# Patient Record
Sex: Female | Born: 1940 | Race: White | Hispanic: No | Smoking: Never smoker
Health system: Southern US, Community
[De-identification: ages and names within clinical notes are randomized; demographics above are authoritative.]

## PROBLEM LIST (undated history)

## (undated) DIAGNOSIS — R011 Cardiac murmur, unspecified: Secondary | ICD-10-CM

## (undated) DIAGNOSIS — T4145XA Adverse effect of unspecified anesthetic, initial encounter: Secondary | ICD-10-CM

## (undated) DIAGNOSIS — E039 Hypothyroidism, unspecified: Secondary | ICD-10-CM

## (undated) DIAGNOSIS — N393 Stress incontinence (female) (male): Secondary | ICD-10-CM

## (undated) DIAGNOSIS — K859 Acute pancreatitis without necrosis or infection, unspecified: Secondary | ICD-10-CM

## (undated) DIAGNOSIS — Z9889 Other specified postprocedural states: Secondary | ICD-10-CM

## (undated) DIAGNOSIS — K219 Gastro-esophageal reflux disease without esophagitis: Secondary | ICD-10-CM

## (undated) DIAGNOSIS — N6019 Diffuse cystic mastopathy of unspecified breast: Secondary | ICD-10-CM

## (undated) DIAGNOSIS — N3020 Other chronic cystitis without hematuria: Secondary | ICD-10-CM

## (undated) DIAGNOSIS — R112 Nausea with vomiting, unspecified: Secondary | ICD-10-CM

## (undated) DIAGNOSIS — I10 Essential (primary) hypertension: Secondary | ICD-10-CM

## (undated) DIAGNOSIS — K8020 Calculus of gallbladder without cholecystitis without obstruction: Secondary | ICD-10-CM

## (undated) DIAGNOSIS — T8859XA Other complications of anesthesia, initial encounter: Secondary | ICD-10-CM

## (undated) DIAGNOSIS — E669 Obesity, unspecified: Secondary | ICD-10-CM

## (undated) DIAGNOSIS — D649 Anemia, unspecified: Secondary | ICD-10-CM

## (undated) DIAGNOSIS — K5790 Diverticulosis of intestine, part unspecified, without perforation or abscess without bleeding: Secondary | ICD-10-CM

## (undated) DIAGNOSIS — R1310 Dysphagia, unspecified: Secondary | ICD-10-CM

## (undated) DIAGNOSIS — M1990 Unspecified osteoarthritis, unspecified site: Secondary | ICD-10-CM

## (undated) HISTORY — PX: TRIGGER FINGER RELEASE: SHX641

## (undated) HISTORY — PX: JOINT REPLACEMENT: SHX530

## (undated) HISTORY — PX: COLONOSCOPY: SHX174

## (undated) HISTORY — PX: CHOLECYSTECTOMY: SHX55

## (undated) HISTORY — PX: MENISCUS REPAIR: SHX5179

## (undated) HISTORY — PX: ABDOMINAL HYSTERECTOMY: SHX81

## (undated) HISTORY — PX: BREAST CYST ASPIRATION: SHX578

## (undated) HISTORY — PX: PANCREAS SURGERY: SHX731

## (undated) HISTORY — PX: TONSILLECTOMY: SUR1361

---

## 1999-06-02 HISTORY — PX: KNEE ARTHROSCOPY: SUR90

## 2004-08-26 ENCOUNTER — Ambulatory Visit: Payer: Self-pay

## 2005-09-08 ENCOUNTER — Ambulatory Visit: Payer: Self-pay | Admitting: Internal Medicine

## 2006-10-14 ENCOUNTER — Ambulatory Visit: Payer: Self-pay | Admitting: Internal Medicine

## 2007-11-02 ENCOUNTER — Ambulatory Visit: Payer: Self-pay | Admitting: Internal Medicine

## 2008-11-06 ENCOUNTER — Ambulatory Visit: Payer: Self-pay | Admitting: Internal Medicine

## 2008-12-07 ENCOUNTER — Ambulatory Visit: Payer: Self-pay | Admitting: Unknown Physician Specialty

## 2009-11-21 ENCOUNTER — Ambulatory Visit: Payer: Self-pay | Admitting: Internal Medicine

## 2010-12-12 ENCOUNTER — Ambulatory Visit: Payer: Self-pay | Admitting: Obstetrics and Gynecology

## 2010-12-25 ENCOUNTER — Ambulatory Visit: Payer: Self-pay | Admitting: Internal Medicine

## 2011-09-22 ENCOUNTER — Inpatient Hospital Stay: Payer: Self-pay | Admitting: Surgery

## 2011-09-22 LAB — CK TOTAL AND CKMB (NOT AT ARMC)
CK, Total: 59 U/L (ref 21–215)
CK-MB: 1.1 ng/mL (ref 0.5–3.6)

## 2011-09-22 LAB — COMPREHENSIVE METABOLIC PANEL
Albumin: 3.6 g/dL (ref 3.4–5.0)
Alkaline Phosphatase: 124 U/L (ref 50–136)
Anion Gap: 6 — ABNORMAL LOW (ref 7–16)
BUN: 17 mg/dL (ref 7–18)
Bilirubin,Total: 0.3 mg/dL (ref 0.2–1.0)
Calcium, Total: 9 mg/dL (ref 8.5–10.1)
Chloride: 106 mmol/L (ref 98–107)
Co2: 30 mmol/L (ref 21–32)
EGFR (Non-African Amer.): >60
Osmolality: 285 (ref 275–301)
SGPT (ALT): 37 U/L
Sodium: 142 mmol/L (ref 136–145)
Total Protein: 7.3 g/dL (ref 6.4–8.2)

## 2011-09-22 LAB — CBC
HCT: 40.8 % (ref 35.0–47.0)
HGB: 13 g/dL (ref 12.0–16.0)
MCH: 27.9 pg (ref 26.0–34.0)
MCV: 87 fL (ref 80–100)
Platelet: 247 10*3/uL (ref 150–440)
RBC: 4.67 10*6/uL (ref 3.80–5.20)
WBC: 7.4 10*3/uL (ref 3.6–11.0)

## 2011-09-22 LAB — LIPASE, BLOOD: Lipase: >3000 U/L (ref 73–393)

## 2011-09-23 LAB — CBC WITH DIFFERENTIAL/PLATELET
Basophil #: 0 10*3/uL (ref 0.0–0.1)
Basophil %: 0.1 %
HCT: 35.8 % (ref 35.0–47.0)
HGB: 11.8 g/dL — ABNORMAL LOW (ref 12.0–16.0)
Lymphocyte #: 1.7 10*3/uL (ref 1.0–3.6)
Lymphocyte %: 19.4 %
MCHC: 32.9 g/dL (ref 32.0–36.0)
MCV: 87 fL (ref 80–100)
Monocyte #: 0.8 x10 3/mm (ref 0.2–0.9)
Monocyte %: 9.2 %
Neutrophil #: 6.1 10*3/uL (ref 1.4–6.5)
Platelet: 189 10*3/uL (ref 150–440)
RBC: 4.12 10*6/uL (ref 3.80–5.20)
RDW: 16.2 % — ABNORMAL HIGH (ref 11.5–14.5)
WBC: 8.5 10*3/uL (ref 3.6–11.0)

## 2011-09-23 LAB — COMPREHENSIVE METABOLIC PANEL
Alkaline Phosphatase: 127 U/L (ref 50–136)
Anion Gap: 6 — ABNORMAL LOW (ref 7–16)
Calcium, Total: 8.4 mg/dL — ABNORMAL LOW (ref 8.5–10.1)
Chloride: 107 mmol/L (ref 98–107)
EGFR (African American): >60
EGFR (Non-African Amer.): >60
Glucose: 100 mg/dL — ABNORMAL HIGH (ref 65–99)
Osmolality: 281 (ref 275–301)
Potassium: 4.1 mmol/L (ref 3.5–5.1)
SGOT(AST): 92 U/L — ABNORMAL HIGH (ref 15–37)
Total Protein: 6.2 g/dL — ABNORMAL LOW (ref 6.4–8.2)

## 2011-09-23 LAB — URINALYSIS, COMPLETE
Bilirubin,UR: NEGATIVE
Blood: NEGATIVE
Ketone: NEGATIVE
Leukocyte Esterase: NEGATIVE
Protein: NEGATIVE
RBC,UR: 1 /HPF (ref 0–5)
Specific Gravity: 1.011 (ref 1.003–1.030)
Squamous Epithelial: 1

## 2011-09-24 LAB — LIPASE, BLOOD: Lipase: 912 U/L — ABNORMAL HIGH (ref 73–393)

## 2011-09-24 LAB — CEA: CEA: 1.9 ng/mL (ref 0.0–4.7)

## 2011-09-24 LAB — CANCER ANTIGEN 19-9: CA 19-9: 15 U/mL (ref 0–35)

## 2011-09-25 LAB — BASIC METABOLIC PANEL
BUN: 7 mg/dL (ref 7–18)
Calcium, Total: 8.3 mg/dL — ABNORMAL LOW (ref 8.5–10.1)
Co2: 25 mmol/L (ref 21–32)
Creatinine: 0.54 mg/dL — ABNORMAL LOW (ref 0.60–1.30)
EGFR (African American): >60
EGFR (Non-African Amer.): >60
Glucose: 53 mg/dL — ABNORMAL LOW (ref 65–99)
Sodium: 142 mmol/L (ref 136–145)

## 2011-09-25 LAB — LIPASE, BLOOD: Lipase: 196 U/L (ref 73–393)

## 2011-09-26 LAB — CBC WITH DIFFERENTIAL/PLATELET
Basophil #: 0 10*3/uL (ref 0.0–0.1)
Eosinophil #: 0.2 10*3/uL (ref 0.0–0.7)
HGB: 11.1 g/dL — ABNORMAL LOW (ref 12.0–16.0)
Lymphocyte #: 1.6 10*3/uL (ref 1.0–3.6)
MCH: 28.4 pg (ref 26.0–34.0)
MCV: 87 fL (ref 80–100)
Monocyte %: 12.3 %
Neutrophil #: 1.4 10*3/uL (ref 1.4–6.5)
Neutrophil %: 38.3 %
Platelet: 177 10*3/uL (ref 150–440)
RBC: 3.9 10*6/uL (ref 3.80–5.20)
RDW: 16 % — ABNORMAL HIGH (ref 11.5–14.5)
WBC: 3.7 10*3/uL (ref 3.6–11.0)

## 2011-09-26 LAB — COMPREHENSIVE METABOLIC PANEL
Albumin: 2.7 g/dL — ABNORMAL LOW (ref 3.4–5.0)
Alkaline Phosphatase: 140 U/L — ABNORMAL HIGH (ref 50–136)
Chloride: 108 mmol/L — ABNORMAL HIGH (ref 98–107)
Co2: 29 mmol/L (ref 21–32)
Creatinine: 0.54 mg/dL — ABNORMAL LOW (ref 0.60–1.30)
EGFR (Non-African Amer.): >60
Osmolality: 286 (ref 275–301)
Potassium: 3.4 mmol/L — ABNORMAL LOW (ref 3.5–5.1)
SGOT(AST): 34 U/L (ref 15–37)
SGPT (ALT): 44 U/L
Sodium: 146 mmol/L — ABNORMAL HIGH (ref 136–145)
Total Protein: 6.2 g/dL — ABNORMAL LOW (ref 6.4–8.2)

## 2011-10-23 ENCOUNTER — Ambulatory Visit: Payer: Self-pay | Admitting: Unknown Physician Specialty

## 2012-02-10 ENCOUNTER — Ambulatory Visit: Payer: Self-pay | Admitting: Internal Medicine

## 2012-09-16 ENCOUNTER — Ambulatory Visit: Payer: Self-pay | Admitting: Emergency Medicine

## 2012-11-15 ENCOUNTER — Emergency Department: Payer: Self-pay | Admitting: Emergency Medicine

## 2012-12-01 ENCOUNTER — Ambulatory Visit: Payer: Self-pay | Admitting: Unknown Physician Specialty

## 2013-02-15 ENCOUNTER — Ambulatory Visit: Payer: Self-pay | Admitting: Internal Medicine

## 2013-06-14 ENCOUNTER — Ambulatory Visit: Payer: Self-pay | Admitting: Unknown Physician Specialty

## 2013-07-09 ENCOUNTER — Ambulatory Visit: Payer: Self-pay | Admitting: Family Medicine

## 2013-10-05 ENCOUNTER — Ambulatory Visit: Payer: Self-pay | Admitting: Unknown Physician Specialty

## 2014-02-08 DIAGNOSIS — R1310 Dysphagia, unspecified: Secondary | ICD-10-CM

## 2014-02-08 HISTORY — DX: Dysphagia, unspecified: R13.10

## 2014-02-26 ENCOUNTER — Ambulatory Visit: Payer: Self-pay | Admitting: Unknown Physician Specialty

## 2014-02-26 HISTORY — PX: ESOPHAGOGASTRODUODENOSCOPY: SHX1529

## 2014-04-04 ENCOUNTER — Ambulatory Visit: Payer: Self-pay | Admitting: Internal Medicine

## 2014-09-23 NOTE — Consult Note (Signed)
Chief Complaint:   Subjective/Chief Complaint Looks lot better than yest. Feels better. Less abd pain. No nausea. Lipase improving. MRI done. Results pending.   VITAL SIGNS/ANCILLARY NOTES: **Vital Signs.:   25-Apr-13 13:27   Vital Signs Type Routine   Temperature Temperature (F) 97.6   Celsius 36.4   Temperature Source oral   Pulse Pulse 65   Pulse source per Dinamap   Respirations Respirations 18   Systolic BP Systolic BP 672   Diastolic BP (mmHg) Diastolic BP (mmHg) 70   Mean BP 87   BP Source Dinamap   Pulse Ox % Pulse Ox % 97   Pulse Ox Activity Level  At rest   Oxygen Delivery Room Air/ 21 %   Brief Assessment:   Cardiac Regular    Respiratory clear BS    Gastrointestinal mild abd tenderness. much improved from yest.   Routine Chem:  25-Apr-13 05:33    Lipase 912   Assessment/Plan:  Assessment/Plan:   Assessment Pancreatitis. Improving.    Plan Await MRI results. Await tumor markers.   Electronic Signatures: Verdie Shire (MD)  (Signed 25-Apr-13 13:31)  Authored: Chief Complaint, VITAL SIGNS/ANCILLARY NOTES, Brief Assessment, Lab Results, Assessment/Plan   Last Updated: 25-Apr-13 13:31 by Verdie Shire (MD)

## 2014-09-23 NOTE — Consult Note (Signed)
Chief Complaint:   Subjective/Chief Complaint Feels much better. No abd pain. Lipase back to normal. Started on clears.   VITAL SIGNS/ANCILLARY NOTES: **Vital Signs.:   26-Apr-13 08:59   Vital Signs Type Q 4hr   Temperature Temperature (F) 98.1   Celsius 36.7   Temperature Source oral   Pulse Pulse 70   Pulse source per Dinamap   Respirations Respirations 18   Systolic BP Systolic BP 199   Diastolic BP (mmHg) Diastolic BP (mmHg) 76   Mean BP 98   BP Source Dinamap   Pulse Ox % Pulse Ox % 97   Pulse Ox Activity Level  At rest   Oxygen Delivery Room Air/ 21 %   Brief Assessment:   Cardiac Regular    Respiratory clear BS    Gastrointestinal Normal   Routine Chem:  26-Apr-13 05:31    Glucose, Serum 53   BUN 7   Creatinine (comp) 0.54   Sodium, Serum 142   Potassium, Serum 3.5   Chloride, Serum 108   CO2, Serum 25   Calcium (Total), Serum 8.3   Osmolality (calc) 279   eGFR (African American) >60   eGFR (Non-African American) >60   Anion Gap 9   Lipase 196   Assessment/Plan:  Assessment/Plan:   Assessment Pancreatitis. CLinically resolved.    Plan Agree with dischage by tomorrow? if tolerates solids. Repeat CT in few wks. I will be out this weekend. If patient to be discharged, then patient can f/u in office later. Thanks.   Electronic Signatures: Verdie Shire (MD)  (Signed 26-Apr-13 13:11)  Authored: Chief Complaint, VITAL SIGNS/ANCILLARY NOTES, Brief Assessment, Lab Results, Assessment/Plan   Last Updated: 26-Apr-13 13:11 by Verdie Shire (MD)

## 2014-09-23 NOTE — Consult Note (Signed)
Pt seen and examined. 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 Previous attempt at ERCP failed due to periampullary tic. Pt developed post ERCP pancreatitis. EUS is an ideal procedure to evaluate this pancreatic tail mass. Unfortunately, at this time we don't know if and when Dr. Ardis Hughs will be here to do EUS. Check CA 19-9/CEA levels. CT guided bx is another option. Can repeat ERCP myself, but there is no guarantee that I will be able to access CBD. There is also increase risk of worsening pancreatitis with ERCP. Discussed options with family. Meanwhile, continue NPO and aggressive IV hydration. Will follow. Thanks.   Electronic Signatures: Verdie Shire (MD) (Signed on 24-Apr-13 15:18)  Authored   Last Updated: 24-Apr-13 15:19 by Verdie Shire (MD)

## 2014-09-23 NOTE — H&P (Signed)
PATIENT NAME:  JOHNISHA, LOUKS MR#:  [id number] DATE OF BIRTH:  [birth year]  DATE OF ADMISSION:  09/22/2011  ADMITTING DIAGNOSIS:  Acute pancreatitis.   HISTORY:  74 year old white female whose history dates back to 2004 at which point she was admitted to the hospital with what appeared to be gallstone pancreatitis. The patient had abdominal pain and nausea at that time and was found to have an elevated lipase. She had an ultrasound which demonstrated stones and sludge and was taken to the operating room at that time. Laparoscopic cholecystectomy and cholangiography were performed. Cholangiography was normal. Operative findings included retroperitoneal edema, stones, sludge, and normal cholangiogram, and nodular-appearing liver. A liver biopsy was performed, the results of which are not available to me at this time in the computer. After surgery, the patient did well. Eight months later the patient was readmitted with significant abdominal pain and recurrent pancreatitis of unclear etiology, thought to be either biliary, sphincter of Oddi dysfunction, or retained stone. An upper endoscopy and ERCP were attempted at that time by Dr. Sonny Masters in April 2005. A large, wide-mouth third portion of the duodenum diverticulum was encountered at that time. Pancreatogram was normal. Maximum diameter of the ducts was noted to be 4 mm. The bile duct could not be cannulated. There was bile drainage from the papilla at that time. No further attempts were made. Post ERCP the patient developed some pancreatitis which resolved after approximately one week's duration in the hospital. She then subsequently went home and did well. She has had no further abdominal pain. The patient does not drink, does not smoke, and is not on any current medications either prescription or over-the-counter. There is no family history of idiopathic pancreatitis. She has felt well. She had occasional belching. Today the patient started having significant  abdominal pain following lunch, followed by the development of abdominal pain and some mild nausea and worsening abdominal pain in her epigastrium. She brought herself to the Emergency Room and was found to have an elevated lipase. A CT scan was performed which I have reviewed with oral and intravenous contrast. 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 There is peripancreatic edema. There is no pancreatic pseudocyst. The pancreas appears edematous. In the tail of the pancreas there is a 4.8 x 3.13 cm homogeneously enhancing mass-like area extending into the spleen. The liver appears normal. The stomach and kidneys and adrenal glands appear normal. Within the left adnexa is an indeterminate-appearing, rounded mass measuring 2.6 x 2.4 cm. There are no previous CT scans at this institution for my review. Surgery was asked to evaluate the patient for admission.   ALLERGIES: Ampicillin and Keflex.   MEDICATIONS:   1. Fish oil.  2. Calcium with vitamin D.  3. Iron tablets. 4. Multivitamins.   PAST MEDICAL HISTORY:  1. Fibrocystic disease of the breast.  2. Diverticulosis of the colon.  3. Cholelithiasis.  4. Pancreatitis of unclear etiology.   PAST SURGICAL HISTORY:  1. Total hysterectomy.  2. Laparoscopic cholecystectomy as described above with cholangiography 2004.   REVIEW OF SYSTEMS: The patient denies any fever, jaundice, chills, or weight loss. She has had some minor belching. No dysphagia. No itching. No rash. No jaundice. Remaining ten-point review unremarkable.   PHYSICAL EXAMINATION:  GENERAL: The patient is alert and oriented with her son at the bedside. She is clearly anicteric. Facies are symmetrical, alert and oriented.   VITAL SIGNS: Temperature 97.2,  pulse 66, respiratory rate 18, blood pressure 174/76, room air saturation 100%. Weight 150 pounds. BMI 26.6.   LUNGS: Clear bilaterally.   HEART:  Regular rate and rhythm.   ABDOMEN: Abdomen demonstrates a lower midline scar. No obvious hernias. Mildly distended, tender throughout with significant tenderness in the epigastrium.   EXTREMITIES: Warm and well perfused.   NEUROLOGIC/PSYCHIATRIC: Normal.   LABORATORY, DIAGNOSTIC, AND RADIOLOGICAL DATA: Lipase greater than 3000, troponin less than 0.02. White count 7.3, hemoglobin 13, hematocrit 40.8, platelet count 247,000. Total bilirubin 0.3. Alkaline phosphatase 124, ALT 37, AST 45, albumin 3.6. Glucose 101, creatinine 0.64, sodium 142, potassium 4.2.   IMPRESSION:  74 year old white female with recurrent pancreatitis and suspicious areas seen within the tail of the pancreas concerning for pancreatic mass although I clearly see a vessel traversing the mass in its entirety which it typically not seen in a cancer. No previous CT scans are available to me at this time.   PLAN: The patient will be admitted, hydrated. Antiemetics and narcotics.  I will review the x-rays personally with the radiologist when he is available. I will ask GI for their evaluation. I suspect that she will require either further attempt at ERCP in the future or MRCP to further evaluate biliary system. Once the pancreatitis has resolved, we will consider re-imaging the pancreas at that time to determine whether the mass is still present. It is rather ill defined.   TOTAL TIME SPENT WITH THE PATIENT, CHART, X-RAYS: 60 minutes.   ____________________________ Jeannette How Marina Gravel, MD mab:bjt D: 09/22/2011 21:33:55 ET T: 09/23/2011 07:08:16 ET JOB#: [id number]  cc: Elta Guadeloupe A. Marina Gravel, MD, <Dictator> Ocie Cornfield. Ouida Sills, MD Manya Silvas, MD Wanda Rideout Bettina Gavia MD ELECTRONICALLY SIGNED 09/23/2011 8:57

## 2014-09-23 NOTE — Consult Note (Signed)
PATIENT NAME:  Felicia Daniels, Felicia Daniels MR#:  [id number] DATE OF BIRTH:  1941-05-06  DATE OF CONSULTATION:  09/23/2011  REFERRING PHYSICIAN:  Sherri Rad, MD  CONSULTING PHYSICIAN:  Verdie Shire, MD/Margean Korell Ruthe Mannan, NP  REASON FOR CONSULTATION: Recurrent pancreatitis.   HISTORY OF PRESENT ILLNESS: Felicia Daniels is a 74 year old Caucasian female who had presented to [facility] yesterday afternoon. Acute onset of abdominal pain after eating lunch occurred approximately half an hour after eating in the epigastrium. She became nauseated, diaphoretic, vomited once. Pain did refer through to her back. Currently she is experiencing dry heaves. The patient is on a PCA pump which is helping to control her pain better. Pain prior to presenting to the ER was causing her to be doubled over, severe, rated as a 10 on a scale of 1 to 10. Normal bowel pattern is every other day. Has not had a bowel movement for two days. Does experience mild constipation at times. No rectal bleeding. No melena. Intentional 15 pound weight loss over the past 1-1/2 months. The patient has been doing [employer].   The patient states this is her fourth episode of pancreatitis. First episode dates back to 2004 when she was admitted for gallstone pancreatitis. She is status post cholecystectomy at that time. The patient was found to have abdominal pain, nausea, as well as elevated lipase at that time. Abdominal ultrasound demonstrated stones and sludge. Laparoscopic cholecystectomy and cholangiogram was performed. Cholangiogram was normal. Operative findings were retroperitoneal edema, stones, sludge, normal cholangiogram, and nodular-appearing liver. A liver biopsy was performed at that time which was unremarkable according to the patient. Eight months later she was readmitted for abdominal pain and recurrent pancreatitis. It was thought to be biliary, possible sphincter of Oddi dysfunction, or retained stone. An ERCP was attempted by Dr. Rhona Leavens but  cholangiogram failed. A large wide mouth third portion of the duodenum diverticulum was encountered at that time. Pancreatogram was normal. Maximum diameter of ducts was noted to be 4 mm. Common bile duct could not be cannulated. The patient states that she has had three episodes of pancreatitis during her lifetime with unknown etiological cause for all of these episodes.   CURRENT MEDICATIONS:  1. Fish Oil. 2. Calcium with Vitamin D. 3. Iron 1 tablet a day.  4. Multivitamin.   PAST MEDICAL HISTORY:  1. Fibrocystic breast disease. 2. Diverticulosis. 3. Cholelithiasis with gallstone-induced pancreatitis at that time. 4. Pancreatitis, four occurrences of unknown etiological cause.   PAST SURGICAL HISTORY:  1. Total hysterectomy.  2. Laparoscopic cholecystectomy. 3. Right torn meniscus repair. 4. Two trigger finger releases.   FAMILY HISTORY: No family history of pancreatic or liver concerns. Daughter has history of breast cancer. Daughter has history of melanoma. Mother has history of skin cancer.   SOCIAL HISTORY: No tobacco. No alcohol use.   REVIEW OF SYSTEMS: All 10 systems reviewed and checked and otherwise unremarkable other than what is stated above.   PHYSICAL EXAMINATION:   VITAL SIGNS: Temperature 98.1, pulse 57, respirations 18, blood pressure 100/66 with pulse oximetry 95%.   GENERAL: Well developed, well nourished 74 year old Caucasian female who appears in moderate distress. Daughter present at bedside.   HEENT: Normocephalic, atraumatic. Pupils equal, reactive to light. Conjunctivae clear. Sclerae anicteric.   NECK: Supple. Trachea midline. No lymphadenopathy or thyromegaly.   PULMONARY: Symmetric rise and fall of chest. Clear to auscultation throughout noted. The patient does express pain with deep breath.   CARDIOVASCULAR: Regular rate and rhythm, S1, S2.  No murmurs. No gallops.   ABDOMEN: Slightly distended. Hypoactive bowel sounds. Marked discomfort throughout  entire abdomen, more localized epigastric. Due to amount of discomfort, unable to totally accurately assess for hepatosplenomegaly but no masses noted superficially. She is not able to tolerate deep palpation.   RECTAL: Deferred.   MUSCULOSKELETAL: Moving all four extremities. No contractures. No clubbing.   EXTREMITIES: No edema.   PSYCH: Alert and oriented x4. Appropriate affect and mood given ill state.   NEUROLOGICAL: No gross neurological deficits.   SKIN: Warm, dry. No lesions. No rashes. No evidence of jaundice or cyanosis.   LABORATORY, DIAGNOSTIC, AND RADIOLOGICAL DATA: Chemistry panel glucose 101, anion gap low at 6, otherwise within normal limits. Calcium was 9.1. Comparison today's date calcium declined to 8.4, glucose 100 today, lipase was greater than 3000. Hepatic panel AST 45 on admission; today's date, April 24th, total protein 6.2, albumin 3.0, and AST increased to 92 from 45.   Cardiac enzymes CK total 59, CK-MB 1.5, troponin less than 0.02. CBC on admission within normal limits except RDW elevated at 16.3, hemoglobin 13.0, hematocrit 40.8. Today hemoglobin declined to 11.8 and hematocrit 35.8. Urinalysis within normal limits.   Chest PA view revealed no acute cardiopulmonary disease.  Three-way of abdomen revealed nonspecific bowel gas pattern, possible air fluid level proximal to the mid small bowel, possible correlation with an ileus.   CT of abdomen and pelvis with contrast revealed prominence of interstitial markings in the lung bases as well as mild hypoventilation, mild intrahepatic biliary ductal dilatation as well as prominence of extrahepatic biliary duct. No evidence of liver masses or regions of abnormal parenchymal small enhancement. Spleen, adrenals, and kidneys are normal. Pancreas evaluation demonstrated peripancreatic fluid without evidence of a pseudocyst. Within the region of the tail a homogeneously enhancing mass-like area is appreciated measuring 4.8 x  3.13 cm maximal orthogonal dimensions extending to the splenic hilar region concerning for a mass in the tail of the pancreas, otherwise, pancreas appeared slightly edematous and there is loss of normal undulating border. Second portion of the duodenum contained a focal area of 4.73 x 2.81 cm containing air-fluid level primarily containing contrast. Correlates with a large duodenal diverticulum versus a duodenal ulcer.   IMPRESSION: This is a 74 year old Caucasian female who presented to [facility] Room for the concern of acute onset of abdominal pain, epigastric, which radiated throughout entire abdomen with associated nausea and vomiting. Known history of recurrent pancreatitis in the past, most recent episode prior to being diagnosed with acute pancreatitis on admission was nine years ago. CT scan of abdomen and pelvis revealing concern of a pancreatic mass in the tail of the pancreas.   PLAN:  1. Will await Dr. Delice Lesch recommendations. The patient is to be seen by him today.  2. 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  3. Will increase the patient's IV fluids to allow increased hydration status given acute pancreatic state at this time. Will increase to 200 mL an hour.  4. Will continue to monitor her pain as well as laboratory evaluation. At some point the patient may benefit from having an EUS performed.  These services provided by Payton Emerald, NP under collaborative agreement with Verdie Shire, MD.  ____________________________ Payton Emerald, NP dsh:drc D: 09/23/2011 13:15:00 ET T: 09/23/2011 13:34:07 ET JOB#: [id number]  cc: Payton Emerald, NP, <Dictator> Payton Emerald MD ELECTRONICALLY SIGNED 09/23/2011 16:29

## 2014-09-23 NOTE — Consult Note (Signed)
Brief Consult Note: Diagnosis: Acute pancreatitis in the setting of known history of three prior episodes of pancreatitis with unknown etiological cause.  Abnormal CT scan of abdomen and pelvis with finding of pancreatic mass.   Consult note dictated.   Comments: Patient's presentation was discussed with Dr. Verdie Shire. Will increase IV fluids of LR to 200 ml/hr. Monitor pain and laboratory results.  Pending surgical evalution by Dr. Eula Listen based on finding of CT scan of abdomen and pelvis.  NPO except for ice chips at this time.  Patient may benefit with EUS in the future.  Electronic Signatures: Payton Emerald (NP)  (Signed 24-Apr-13 13:19)  Authored: Brief Consult Note   Last Updated: 24-Apr-13 13:19 by Payton Emerald (NP)

## 2014-09-23 NOTE — Discharge Summary (Signed)
PATIENT NAME:  Felicia Daniels, Felicia Daniels MR#:  [id number] DATE OF BIRTH:  August 06, 1940  DATE OF ADMISSION:  09/22/2011 DATE OF DISCHARGE:  09/26/2011  DISCHARGE DIAGNOSES:  1. Idiopathic pancreatitis. 2. Diverticulosis.   PROCEDURES: None.   CONSULTANT: Gastroenterology service.   HISTORY OF PRESENT ILLNESS/HOSPITAL COURSE: This is a patient with a long history of acute appendicitis which has been recurrent. This has happened several times. She has had a work-up in the past. She has had two ERCPs in the past. She has had her gallbladder removed nine years ago and has never shown any signs of retained stone. She was admitted to the hospital with the diagnosis of acute pancreatitis. Her lipase promptly returned to normal. Her pain is resolved at this point and she is tolerating a clear liquid diet.   I discussed with her the fact that we do not know the cause of this and this has been worked up by her gastroenterology service. Both Dr. Candace Cruise and Dr. Vira Agar have seen the patient. We are not sure of the actual cause of this, but it would be prudent to limit the fat intake in this patient. She is currently on a clear liquid diet and tolerating it without problems and not having any signs of dehydration. I suggested discharge today on clear liquids with the instructions to slowly advance, avoiding high fat foods and staying away from spicy foods as well. This was all reviewed for her and she was in agreement with plans for discharge. She will follow up with Dr. Ouida Sills, her primary care physician, as well as the gastroenterology service, either Dr. Candace Cruise or Dr. Vira Agar the next week.  ____________________________ Jerrol Banana. Burt Knack, MD rec:ap D: 09/26/2011 09:35:19 ET T: 09/26/2011 14:43:10 ET JOB#: [id number] cc: Jerrol Banana. Burt Knack, MD, <Dictator> Florene Glen MD ELECTRONICALLY SIGNED 09/26/2011 15:57

## 2015-01-28 ENCOUNTER — Encounter: Payer: Self-pay | Admitting: Emergency Medicine

## 2015-01-28 ENCOUNTER — Observation Stay
Admission: EM | Admit: 2015-01-28 | Discharge: 2015-01-29 | Disposition: A | Discharge status: Home / Self Care | Payer: Medicare Other | Attending: Internal Medicine | Admitting: Internal Medicine

## 2015-01-28 ENCOUNTER — Emergency Department: Payer: Medicare Other

## 2015-01-28 DIAGNOSIS — K858 Other acute pancreatitis without necrosis or infection: Secondary | ICD-10-CM

## 2015-01-28 DIAGNOSIS — K5730 Diverticulosis of large intestine without perforation or abscess without bleeding: Secondary | ICD-10-CM | POA: Insufficient documentation

## 2015-01-28 DIAGNOSIS — R599 Enlarged lymph nodes, unspecified: Secondary | ICD-10-CM | POA: Insufficient documentation

## 2015-01-28 DIAGNOSIS — K859 Acute pancreatitis without necrosis or infection, unspecified: Secondary | ICD-10-CM | POA: Diagnosis present

## 2015-01-28 DIAGNOSIS — Z23 Encounter for immunization: Secondary | ICD-10-CM | POA: Insufficient documentation

## 2015-01-28 DIAGNOSIS — D1771 Benign lipomatous neoplasm of kidney: Secondary | ICD-10-CM | POA: Insufficient documentation

## 2015-01-28 DIAGNOSIS — Z881 Allergy status to other antibiotic agents status: Secondary | ICD-10-CM | POA: Diagnosis not present

## 2015-01-28 DIAGNOSIS — Z808 Family history of malignant neoplasm of other organs or systems: Secondary | ICD-10-CM | POA: Insufficient documentation

## 2015-01-28 DIAGNOSIS — K449 Diaphragmatic hernia without obstruction or gangrene: Secondary | ICD-10-CM | POA: Diagnosis not present

## 2015-01-28 DIAGNOSIS — K861 Other chronic pancreatitis: Secondary | ICD-10-CM | POA: Diagnosis not present

## 2015-01-28 DIAGNOSIS — Z9049 Acquired absence of other specified parts of digestive tract: Secondary | ICD-10-CM | POA: Diagnosis not present

## 2015-01-28 DIAGNOSIS — Z79899 Other long term (current) drug therapy: Secondary | ICD-10-CM | POA: Diagnosis not present

## 2015-01-28 DIAGNOSIS — R748 Abnormal levels of other serum enzymes: Secondary | ICD-10-CM | POA: Insufficient documentation

## 2015-01-28 DIAGNOSIS — Z880 Allergy status to penicillin: Secondary | ICD-10-CM | POA: Insufficient documentation

## 2015-01-28 DIAGNOSIS — Z803 Family history of malignant neoplasm of breast: Secondary | ICD-10-CM | POA: Insufficient documentation

## 2015-01-28 DIAGNOSIS — R1013 Epigastric pain: Secondary | ICD-10-CM | POA: Diagnosis not present

## 2015-01-28 DIAGNOSIS — N6019 Diffuse cystic mastopathy of unspecified breast: Secondary | ICD-10-CM | POA: Insufficient documentation

## 2015-01-28 HISTORY — DX: Acute pancreatitis without necrosis or infection, unspecified: K85.90

## 2015-01-28 HISTORY — DX: Diverticulosis of intestine, part unspecified, without perforation or abscess without bleeding: K57.90

## 2015-01-28 HISTORY — DX: Calculus of gallbladder without cholecystitis without obstruction: K80.20

## 2015-01-28 HISTORY — DX: Diffuse cystic mastopathy of unspecified breast: N60.19

## 2015-01-28 LAB — URINALYSIS COMPLETE WITH MICROSCOPIC (ARMC ONLY)
Bilirubin Urine: NEGATIVE
GLUCOSE, UA: NEGATIVE mg/dL
Hgb urine dipstick: NEGATIVE
Ketones, ur: NEGATIVE mg/dL
NITRITE: NEGATIVE
Protein, ur: NEGATIVE mg/dL
SPECIFIC GRAVITY, URINE: 1.005 (ref 1.005–1.030)
pH: 6 (ref 5.0–8.0)

## 2015-01-28 LAB — COMPREHENSIVE METABOLIC PANEL
ALBUMIN: 3.9 g/dL (ref 3.5–5.0)
ALK PHOS: 241 U/L — AB (ref 38–126)
ALT: 34 U/L (ref 14–54)
AST: 39 U/L (ref 15–41)
Anion gap: 7 (ref 5–15)
BILIRUBIN TOTAL: 0.7 mg/dL (ref 0.3–1.2)
BUN: 20 mg/dL (ref 6–20)
CALCIUM: 9.3 mg/dL (ref 8.9–10.3)
CO2: 26 mmol/L (ref 22–32)
Chloride: 107 mmol/L (ref 101–111)
Creatinine, Ser: 0.6 mg/dL (ref 0.44–1.00)
GFR calc Af Amer: >60 mL/min (ref >60)
GFR calc non Af Amer: >60 mL/min (ref >60)
GLUCOSE: 94 mg/dL (ref 65–99)
Potassium: 3.9 mmol/L (ref 3.5–5.1)
Sodium: 140 mmol/L (ref 135–145)
TOTAL PROTEIN: 8.1 g/dL (ref 6.5–8.1)

## 2015-01-28 LAB — CBC WITH DIFFERENTIAL/PLATELET
BASOS PCT: 1 %
Basophils Absolute: 0 10*3/uL (ref 0–0.1)
EOS ABS: 0.1 10*3/uL (ref 0–0.7)
EOS PCT: 1 %
HCT: 36.3 % (ref 35.0–47.0)
Hemoglobin: 12.1 g/dL (ref 12.0–16.0)
Lymphocytes Relative: 30 %
Lymphs Abs: 1.8 10*3/uL (ref 1.0–3.6)
MCH: 29.6 pg (ref 26.0–34.0)
MCHC: 33.4 g/dL (ref 32.0–36.0)
MCV: 88.8 fL (ref 80.0–100.0)
MONO ABS: 0.6 10*3/uL (ref 0.2–0.9)
MONOS PCT: 9 %
Neutro Abs: 3.6 10*3/uL (ref 1.4–6.5)
Neutrophils Relative %: 59 %
Platelets: 283 10*3/uL (ref 150–440)
RBC: 4.09 MIL/uL (ref 3.80–5.20)
RDW: 14.1 % (ref 11.5–14.5)
WBC: 6.1 10*3/uL (ref 3.6–11.0)

## 2015-01-28 LAB — LIPASE, BLOOD: Lipase: 601 U/L — ABNORMAL HIGH (ref 22–51)

## 2015-01-28 MED ORDER — DOCUSATE SODIUM 100 MG PO CAPS
100.0000 mg | ORAL_CAPSULE | Freq: Two times a day (BID) | ORAL | Status: DC
  Administered 2015-01-28 – 2015-01-29 (×3): 100 mg via ORAL
  Filled 2015-01-28 (×3): qty 1

## 2015-01-28 MED ORDER — ONDANSETRON HCL 4 MG/2ML IJ SOLN
4.0000 mg | Freq: Once | INTRAMUSCULAR | Status: AC
  Administered 2015-01-28: 4 mg via INTRAVENOUS
  Filled 2015-01-28: qty 2

## 2015-01-28 MED ORDER — ENOXAPARIN SODIUM 40 MG/0.4ML ~~LOC~~ SOLN
40.0000 mg | SUBCUTANEOUS | Status: DC
  Administered 2015-01-28: 16:00:00 40 mg via SUBCUTANEOUS
  Filled 2015-01-28 (×2): qty 0.4

## 2015-01-28 MED ORDER — MORPHINE SULFATE (PF) 2 MG/ML IV SOLN
2.0000 mg | Freq: Once | INTRAVENOUS | Status: AC
  Administered 2015-01-28: 2 mg via INTRAVENOUS
  Filled 2015-01-28: qty 1

## 2015-01-28 MED ORDER — IOHEXOL 240 MG/ML SOLN
25.0000 mL | Freq: Once | INTRAMUSCULAR | Status: DC | PRN
  Administered 2015-01-28: 25 mL via INTRAVENOUS
  Filled 2015-01-28: qty 50

## 2015-01-28 MED ORDER — ACETAMINOPHEN 650 MG RE SUPP: 650.0000 mg | Freq: Four times a day (QID) | RECTAL | Status: DC | PRN

## 2015-01-28 MED ORDER — ALUM & MAG HYDROXIDE-SIMETH 200-200-20 MG/5ML PO SUSP: 30.0000 mL | Freq: Four times a day (QID) | ORAL | Status: DC | PRN

## 2015-01-28 MED ORDER — SODIUM CHLORIDE 0.9 % IV BOLUS (SEPSIS)
1000.0000 mL | Freq: Once | INTRAVENOUS | Status: AC
  Administered 2015-01-28: 1000 mL via INTRAVENOUS

## 2015-01-28 MED ORDER — IOHEXOL 300 MG/ML  SOLN
100.0000 mL | Freq: Once | INTRAMUSCULAR | Status: AC | PRN
  Administered 2015-01-28: 100 mL via INTRAVENOUS

## 2015-01-28 MED ORDER — PROMETHAZINE HCL 25 MG/ML IJ SOLN
12.5000 mg | Freq: Four times a day (QID) | INTRAMUSCULAR | Status: DC | PRN
  Administered 2015-01-28: 12.5 mg via INTRAVENOUS
  Filled 2015-01-28: qty 1

## 2015-01-28 MED ORDER — ONDANSETRON HCL 4 MG/2ML IJ SOLN
4.0000 mg | Freq: Four times a day (QID) | INTRAMUSCULAR | Status: DC | PRN
  Administered 2015-01-28: 4 mg via INTRAVENOUS
  Filled 2015-01-28: qty 2

## 2015-01-28 MED ORDER — URSODIOL 300 MG PO CAPS
300.0000 mg | ORAL_CAPSULE | Freq: Two times a day (BID) | ORAL | Status: DC
  Administered 2015-01-28 – 2015-01-29 (×2): 300 mg via ORAL
  Filled 2015-01-28 (×4): qty 1

## 2015-01-28 MED ORDER — HYDROCODONE-ACETAMINOPHEN 5-325 MG PO TABS
1.0000 | ORAL_TABLET | ORAL | Status: DC | PRN
  Administered 2015-01-28: 1 via ORAL
  Filled 2015-01-28: qty 1

## 2015-01-28 MED ORDER — MORPHINE SULFATE (PF) 2 MG/ML IV SOLN
2.0000 mg | INTRAVENOUS | Status: DC | PRN
  Administered 2015-01-28: 2 mg via INTRAVENOUS
  Filled 2015-01-28: qty 1

## 2015-01-28 MED ORDER — LACTATED RINGERS IV SOLN
INTRAVENOUS | Status: DC
  Administered 2015-01-28: 13:00:00 via INTRAVENOUS

## 2015-01-28 MED ORDER — SODIUM CHLORIDE 0.9 % IV SOLN
INTRAVENOUS | Status: DC
  Administered 2015-01-28 – 2015-01-29 (×2): via INTRAVENOUS

## 2015-01-28 MED ORDER — ACETAMINOPHEN 325 MG PO TABS: 650.0000 mg | ORAL_TABLET | Freq: Four times a day (QID) | ORAL | Status: DC | PRN

## 2015-01-28 MED ORDER — PANTOPRAZOLE SODIUM 40 MG PO TBEC
40.0000 mg | DELAYED_RELEASE_TABLET | Freq: Every day | ORAL | Status: DC
  Administered 2015-01-28 – 2015-01-29 (×2): 40 mg via ORAL
  Filled 2015-01-28 (×3): qty 1

## 2015-01-28 MED ORDER — INFLUENZA VAC SPLIT QUAD 0.5 ML IM SUSY
0.5000 mL | PREFILLED_SYRINGE | INTRAMUSCULAR | Status: AC
  Administered 2015-01-29: 0.5 mL via INTRAMUSCULAR
  Filled 2015-01-28: qty 0.5

## 2015-01-28 MED ORDER — ONDANSETRON HCL 4 MG PO TABS: 4.0000 mg | ORAL_TABLET | Freq: Four times a day (QID) | ORAL | Status: DC | PRN

## 2015-01-28 NOTE — ED Notes (Signed)
Pt with epigastric pain started last night. Pt with hx of pancreatitis and thinks it is related to same.

## 2015-01-28 NOTE — H&P (Signed)
[facility] NAME: Felicia Daniels    MR#:  [id number]  DATE OF BIRTH:  09-23-1940  DATE OF ADMISSION:  01/28/2015  PRIMARY CARE PHYSICIAN: Kirk Ruths., MD   REQUESTING/REFERRING PHYSICIAN: Dr.Stafford  CHIEF COMPLAINT:   Chief Complaint  Patient presents with  . Abdominal Pain    HISTORY OF PRESENT ILLNESS:  Felicia Daniels  is a 74 y.o. female with a known history of pancreatitis presents to the hospital complaining of abdominal pain radiating to the back. This pain started yesterday night she has had severe nausea and dry heaving but no vomiting. No diarrhea or constipation. Afebrile.. In the emergency room patient has been found to have elevated lipase of 600 and is being admitted for acute on chronic pancreatitis. CT scan of the abdomen shows calcified chronic pancreatic changes. Patient has had recurrent pancreatitis in the past including multiple ERCPs and cholecystectomy. Her last episode was 3-4 years back. She doesn't drink alcohol. Patient continues to have significant abdominal pain and nausea and is being admitted to the hospital.  PAST MEDICAL HISTORY:   Past Medical History  Diagnosis Date  . Pancreatitis   . Diverticulosis   . Cholelithiasis   . Fibrocystic disease of breast     PAST SURGICAL HISTORY:  History reviewed. No pertinent past surgical history.  SOCIAL HISTORY:   Social History  Substance Use Topics  . Smoking status: Never Smoker   . Smokeless tobacco: Not on file  . Alcohol Use: No    FAMILY HISTORY:   Family History  Problem Relation Age of Onset  . Skin cancer Mother   . Breast cancer Daughter     DRUG ALLERGIES:   Allergies  Allergen Reactions  . Keflex [Cephalexin] Rash  . Penicillins Rash    REVIEW OF SYSTEMS:   Review of Systems  Constitutional: Positive for malaise/fatigue. Negative for fever, chills and weight loss.  HENT: Negative for hearing loss and  nosebleeds.   Eyes: Negative for blurred vision, double vision and pain.  Respiratory: Negative for cough, hemoptysis, sputum production, shortness of breath and wheezing.   Cardiovascular: Negative for chest pain, palpitations, orthopnea and leg swelling.  Gastrointestinal: Negative for nausea, vomiting, abdominal pain, diarrhea and constipation.  Genitourinary: Negative for dysuria and hematuria.  Musculoskeletal: Negative for myalgias, back pain and falls.  Skin: Negative for rash.  Neurological: Negative for dizziness, tremors, sensory change, speech change, focal weakness, seizures and headaches.  Endo/Heme/Allergies: Does not bruise/bleed easily.  Psychiatric/Behavioral: Negative for depression and memory loss. The patient is not nervous/anxious.     MEDICATIONS AT HOME:   Prior to Admission medications   Medication Sig Start Date End Date Taking? Authorizing Provider  calcium carbonate (OS-CAL - DOSED IN MG OF ELEMENTAL CALCIUM) 1250 (500 CA) MG tablet Take 1 tablet by mouth daily.   Yes Historical Provider, MD  Cholecalciferol (VITAMIN D3) 1000 UNITS CAPS Take 1 tablet by mouth daily.   Yes Historical Provider, MD  Multiple Vitamin (MULTI-VITAMINS) TABS Take 1 tablet by mouth daily.   Yes Historical Provider, MD  omeprazole (PRILOSEC) 20 MG capsule Take 1 capsule by mouth daily. 01/07/15  Yes Historical Provider, MD  ursodiol (ACTIGALL) 300 MG capsule Take 1 capsule by mouth 2 (two) times daily. 10/10/14  Yes Historical Provider, MD      VITAL SIGNS:  Blood pressure 126/56, pulse 65, temperature 98.2 F (36.8 C), temperature source Oral, resp. rate 16, height 5\' 3"  (1.6 m),  weight 72.576 kg (160 lb), SpO2 94 %.  PHYSICAL EXAMINATION:  Physical Exam  GENERAL:  74 y.o.-year-old patient lying in the bed with no acute distress.  EYES: Pupils equal, round, reactive to light and accommodation. No scleral icterus. Extraocular muscles intact.  HEENT: Head atraumatic, normocephalic.  Oropharynx and nasopharynx clear. No oropharyngeal erythema, moist oral mucosa  NECK:  Supple, no jugular venous distention. No thyroid enlargement, no tenderness.  LUNGS: Normal breath sounds bilaterally, no wheezing, rales, rhonchi. No use of accessory muscles of respiration.  CARDIOVASCULAR: S1, S2 normal. No murmurs, rubs, or gallops.  ABDOMEN: Soft, nondistended. Bowel sounds present. No organomegaly or mass. Diffuse tenderness in upper abdomen. EXTREMITIES: No pedal edema, cyanosis, or clubbing. + 2 pedal & radial pulses b/l.   NEUROLOGIC: Cranial nerves II through XII are intact. No focal Motor or sensory deficits appreciated b/l PSYCHIATRIC: The patient is alert and oriented x 3. Good affect.  SKIN: No obvious rash, lesion, or ulcer.   LABORATORY PANEL:   CBC  Recent Labs Lab 01/28/15 1128  WBC 6.1  HGB 12.1  HCT 36.3  PLT 283   ------------------------------------------------------------------------------------------------------------------  Chemistries   Recent Labs Lab 01/28/15 1128  NA 140  K 3.9  CL 107  CO2 26  GLUCOSE 94  BUN 20  CREATININE 0.60  CALCIUM 9.3  AST 39  ALT 34  ALKPHOS 241*  BILITOT 0.7   ------------------------------------------------------------------------------------------------------------------  Cardiac Enzymes No results for input(s): TROPONINI in the last 168 hours. ------------------------------------------------------------------------------------------------------------------  RADIOLOGY:  Ct Abdomen Pelvis W Contrast  01/28/2015   CLINICAL DATA:  Epigastric abdominal pain since last evening. Prior history of pancreatitis.  EXAM: CT ABDOMEN AND PELVIS WITH CONTRAST  TECHNIQUE: Multidetector CT imaging of the abdomen and pelvis was performed using the standard protocol following bolus administration of intravenous contrast.  CONTRAST:  125mL OMNIPAQUE IOHEXOL 300 MG/ML SOLN, 41mL OMNIPAQUE IOHEXOL 240 MG/ML SOLN  COMPARISON:  CT  scan 10/23/2011  FINDINGS: Lower chest: The lung bases are somewhat degraded by a breathing motion artifact. There is minimal areas of subpleural atelectasis and scarring change. No infiltrates or effusions. The heart is normal in size. No pericardial effusion. There is moderate distal esophageal wall thickening which appears relatively stable. There is also a small hiatal hernia and this could be chronic reflux esophagitis.  Hepatobiliary: No focal hepatic lesions. There is mild intra and extrahepatic biliary dilatation due to prior cholecystectomy. Mild stable dilatation of the common bile duct.  Pancreas: There are calcifications in pancreatic head and moderate dilatation of the pancreatic duct. Findings most likely due to chronic calcific pancreatitis. I do not see any definite CT findings for acute pancreatitis. No pancreatic mass. There is a large duodenum diverticulum noted near the pancreatic head.  Spleen: Normal size.  No focal lesions.  Adrenals/Urinary Tract: The adrenal glands are unremarkable and stable.  There is a 4.6 cm fatty exophytic mass projecting off the upper pole region of left kidney most consistent with an angiomyolipoma. Because of its size there is a propensity for hemorrhage. Recommend urology consultation and consideration for treatment.  Stomach/Bowel: The stomach, duodenum, small bowel and colon are unremarkable. No inflammatory changes, mass lesions or obstructive findings. There is moderate diverticulosis involving the sigmoid colon and moderate stool throughout the colon which may suggest constipation. The terminal ileum and appendix are normal.  Vascular/Lymphatic: No mesenteric or retroperitoneal mass or adenopathy. Stable borderline enlarged lymph node in the hepatic duodenum ligament.  Other: The uterus is surgically absent. Both  ovaries are still present. Stable calcifications associated with a left ovary. No pelvic mass or adenopathy. No free pelvic fluid collections. The  bladder appears normal. No inguinal mass or adenopathy.  Musculoskeletal: No significant bony findings.  IMPRESSION: 1. Changes consistent with chronic calcific pancreatitis. No findings for acute superimposed pancreatitis. 2. Status postcholecystectomy with stable intra and extrahepatic biliary dilatation. 3. 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 Urology consultation suggested for consideration for treatment. 4. Stable calcifications associated with a slightly enlarged left ovary. Stability over 3 years would suggest a benign process. 5. No acute abdominal/pelvic findings.   Electronically Signed   By: Marijo Sanes M.D.   On: 01/28/2015 13:41     IMPRESSION AND PLAN:   * Acute on chronic pancreatitis Lipase is elevated at 600. CT scan of the abdomen shows chronic pancreatitis. Will start patient on IV fluids along with pain medications and anti-medics. Liquid diet. Patient will be admitted under observation and hopefully can be discharged home tomorrow if her lipase is trending down. Afebrile and has normal white count.  * DVT prophylaxis with Lovenox.   All the records are reviewed and case discussed with ED provider. Management plans discussed with the patient, family and they are in agreement.  CODE STATUS: FULL  TOTAL TIME TAKING CARE OF THIS PATIENT: 45 minutes.    Hillary Bow R M.D on 01/28/2015 at 1:51 PM  Between 7am to 6pm - Pager - [phone]  After 6pm go to [email or web address] - password [email or web address] [facility]  Office  [phone]  CC: Primary care physician; Kirk Ruths., MD

## 2015-01-28 NOTE — ED Notes (Signed)
Patient transported to CT 

## 2015-01-28 NOTE — ED Provider Notes (Signed)
[facility] Emergency Department Provider Note  ____________________________________________  Time seen: 11:10 AM  I have reviewed the triage vital signs and the nursing notes.   HISTORY  Chief Complaint Abdominal Pain    HPI Felicia Daniels is a 74 y.o. female who complains of epigastric pain that started last night. It radiates through to her back. She's had pancreatitis in the past and this feels similar. She denies any history of AAA that she knows of. She states that she has had a cholecystectomy in the past, but she develop stones in the pancreas itself. Her last episode was 4 years ago. With this she is having nausea and oral intolerance with 1-2 episodes of vomiting at home. Denies any change in bowel habits, no rectal bleeding or melena. Fever chills chest pain shortness of breath cough or runny nose. No recent trauma   Active Allergies Allergen Noted Date Severity Reactions Comments  Keflex 10/24/2013  Rash   Penicillins 10/24/2013  Rash   Synthroid 10/24/2013  Other (See Comments) Flu-like symptoms, choking feeling   Current Medications Prescription Sig. Disp. Refills Start Date End Date Status  calcium carbonate 500 mg calcium (1,250 mg) tablet Take 500 mg of elemental by mouth once daily.     Active  multivitamin tablet Take 1 tablet by mouth once daily.     Active  cholecalciferol (VITAMIN D3) 1,000 unit capsule Take 1,000 Units by mouth once daily.     Active  ursodiol (ACTIGALL) 300 mg capsule TAKE ONE CAPSULE BY MOUTH BY MOUTH TWICE DAILY 60 capsule  PRN 10/10/2014  Active  omeprazole (PRILOSEC) 20 MG DR capsule TAKE ONE CAPSULE BY MOUTH DAILY 30 capsule  1 01/07/2015  Active  omeprazole (PRILOSEC) 20 MG DR capsule TAKE 1 TABLET BY MOUTH ONCE DAILY 30 capsule  6 06/06/2014 01/07/2015 Discontinued   Hospital, Clinic, or Other Facility Administered Medication Ordered Dose Route Frequency Start Date End Date Status   triamcinolone acetonide (KENALOG-40) 40 mg injection 40mg  IAtc Once 01/01/2015 01/01/2015 Ended  lidocaine (PF) (XYLOCAINE) 1 % injection 5 mL 61mL IAtc Once 01/01/2015 01/01/2015 Ended  bupivacaine (PF) (MARCAINE) 0.25 % injection 4 mL 47mL IAtc Once 01/01/2015 01/01/2015 Ended  Active Problems Problem Noted Date  Left hip pain 01/01/2015  Osteoarthritis of carpometacarpal joint of right thumb, unspecified osteoarthritis type 06/07/2014  FH: colon polyps 02/08/2014  Dysphagia 02/08/2014  Degenerative arthritis of right knee 11/07/2013  Hypertension   Anemia   Fibrocystic breast disease   Gallstone pancreatitis   Thyroid disease   Most Recent Encounters Date Type Specialty Providers Description  01/28/2015 Telephone Gastroenterology Dorise Hiss, MD  Abdominal Pain  01/07/2015 Refill Gastroenterology Gershon Mussel, NP    01/01/2015 Office Visit Orthopaedics Debera Lat., MD  Left hip pain (Primary Dx)  Immunizations Name Dates Previously Given Next Due  Influenza IIV3, IM preservative 03/05/2014   Surgical History Surgery Date Comments  Hysterectomy 1976   Knee Arthroscopy 2001   Cholecystectomy    Left middle finger trigger release 08/05/2011   Colonoscopy 02/26/2003 FH Colon Polyps (Father)  Colonoscopy 12/07/2008 FH Colon Polyps (Father)  Ercp 09/07/2003   Colonoscopy 02/26/2014 FH Colon Polyps (Father): CBF 01/2019  Egd 02/26/2014 No repeat per RTE (dw)  Medical History Medical History Date Comments  Hypertension    Fibrocystic breast disease    Gallstone pancreatitis    Thyroid disease    Degenerative arthritis  Right knee  Hypertension    Anemia    Gallstone  pancreatitis    FH: colon polyps 02/08/2014   Dysphagia 02/08/2014   History of bone density study 09/17/2006   Family History Medical History Relation Name Comments  Diabetes type II Father    Breast cancer Daughter ##Daughter1   Heart disease  Paternal Grandmother    Social History Tobacco Use Types Packs/Day Years Used Date  Never Smoker       Smokeless Tobacco: Never Used      Alcohol Use Drinks/Week oz/Week Comments  No 0 Standard drinks or equivalent  0.0       Past Medical History  Diagnosis Date  . Pancreatitis      There are no active problems to display for this patient.    History reviewed. No pertinent past surgical history.   No current outpatient prescriptions on file.   Allergies Keflex and Penicillins   No family history on file.  Social History Social History  Substance Use Topics  . Smoking status: Never Smoker   . Smokeless tobacco: None  . Alcohol Use: No    Review of Systems  Constitutional:   No fever or chills. No weight changes Eyes:   No blurry vision or double vision.  ENT:   No sore throat. Cardiovascular:   No chest pain. Respiratory:   No dyspnea or cough. Gastrointestinal:   Epigastric pain with nausea and vomiting.Marland Kitchen  No BRBPR or melena. Genitourinary:   Negative for dysuria, urinary retention, bloody urine, or difficulty urinating. Musculoskeletal:   Negative for back pain. No joint swelling or pain. Skin:   Negative for rash. Neurological:   Negative for headaches, focal weakness or numbness. Psychiatric:  No anxiety or depression.   Endocrine:  No hot/cold intolerance, changes in energy, or sleep difficulty.  10-point ROS otherwise negative.  ____________________________________________   PHYSICAL EXAM:  VITAL SIGNS: ED Triage Vitals  Enc Vitals Group     BP 01/28/15 1101 181/64 mmHg     Pulse Rate 01/28/15 1101 66     Resp 01/28/15 1101 20     Temp 01/28/15 1103 98.2 F (36.8 C)     Temp Source 01/28/15 1103 Oral     SpO2 01/28/15 1101 100 %     Weight 01/28/15 1101 160 lb (72.576 kg)     Height 01/28/15 1101 5\' 3"  (1.6 m)     Head Cir --      Peak Flow --      Pain Score 01/28/15 1102 4     Pain Loc --      Pain Edu? --      Excl.  in [place]? --      Constitutional:   Alert and oriented. Well appearing and in mild distress  Eyes:   No scleral icterus. No conjunctival pallor. PERRL. EOMI ENT   Head:   Normocephalic and atraumatic.   Nose:   No congestion/rhinnorhea. No septal hematoma   Mouth/Throat:   MMM, no pharyngeal erythema. No peritonsillar mass. No uvula shift.   Neck:   No stridor. No SubQ emphysema. No meningismus. Hematological/Lymphatic/Immunilogical:   No cervical lymphadenopathy. Cardiovascular:   RRR. Normal and symmetric distal pulses are present in all extremities. No murmurs, rubs, or gallops. Respiratory:   Normal respiratory effort without tachypnea nor retractions. Breath sounds are clear and equal bilaterally. No wheezes/rales/rhonchi. Gastrointestinal:   Soft with epigastric tenderness. No distention. There is no CVA tenderness.  No rebound, rigidity, or guarding. No bruit or pulsatile mass Genitourinary:   deferred Musculoskeletal:   Nontender with  normal range of motion in all extremities. No joint effusions.  No lower extremity tenderness.  No edema. Neurologic:   Normal speech and language.  CN 2-10 normal. Motor grossly intact. No pronator drift.  Normal gait. No gross focal neurologic deficits are appreciated.  Skin:    Skin is warm, dry and intact. No rash noted.  No petechiae, purpura, or bullae. Psychiatric:   Mood and affect are normal. Speech and behavior are normal. Patient exhibits appropriate insight and judgment.  ____________________________________________    LABS (pertinent positives/negatives) (all labs ordered are listed, but only abnormal results are displayed) Labs Reviewed  COMPREHENSIVE METABOLIC PANEL - Abnormal; Notable for the following:    Alkaline Phosphatase 241 (*)    All other components within normal limits  LIPASE, BLOOD - Abnormal; Notable for the following:    Lipase 601 (*)    All other components within normal limits  URINALYSIS  COMPLETEWITH MICROSCOPIC ([facility] ONLY) - Abnormal; Notable for the following:    Color, Urine YELLOW (*)    APPearance CLEAR (*)    Leukocytes, UA TRACE (*)    Bacteria, UA MANY (*)    Squamous Epithelial / LPF 0-5 (*)    All other components within normal limits  CBC WITH DIFFERENTIAL/PLATELET   ____________________________________________   EKG  Interpreted by me Normal sinus rhythm rate of 62, normal axis intervals QRS ST segments and T waves.  ____________________________________________    RADIOLOGY  CT abdomen and pelvis does not show acute significant findings. Abdominal aorta is less than 2 cm in diameter.  ____________________________________________   PROCEDURES   ____________________________________________   INITIAL IMPRESSION / ASSESSMENT AND PLAN / ED COURSE  Pertinent labs & imaging results that were available during my care of the patient were reviewed by me and considered in my medical decision making (see chart for details).  Patient presents with symptoms and exam consistent with pancreatitis as in the past. We'll check labs and urine, give antiemetics and IV morphine as well as fluids. I'm unable to find any previous imaging evaluating the size of her aorta, so we'll also obtain a CT scan of the abdomen to evaluate for AAA.  ----------------------------------------- 1:43 PM on 01/28/2015 -----------------------------------------  Lipase is 600 consistent with pancreatitis. We'll follow-up CT scan result. I discussed the case with the hospitalist to evaluate the patient for admission. We'll keep her nothing by mouth, continue 1.5x maintenance IV fluids. She is requiring repeat dosing of IV analgesia and antiemetics due to ongoing symptoms.     ____________________________________________   FINAL CLINICAL IMPRESSION(S) / ED DIAGNOSES  Final diagnoses:  Other acute pancreatitis      Carrie Mew, MD 01/28/15 1347

## 2015-01-28 NOTE — Plan of Care (Signed)
Problem: Discharge Progression Outcomes Goal: Pain controlled with appropriate interventions Outcome: Not Progressing Pain med in ed and on floor. Pain med  Improved pain Goal: Hemodynamically stable Outcome: Progressing Lipase 381 Goal: Complications resolved/controlled Outcome: Not Progressing Pain  Med ordered and given along with nausea meds Goal: Tolerating diet Outcome: Progressing liquids Goal: Activity appropriate for discharge plan Outcome: Progressing oob with assist tol well

## 2015-01-29 DIAGNOSIS — K859 Acute pancreatitis, unspecified: Secondary | ICD-10-CM | POA: Diagnosis not present

## 2015-01-29 LAB — CBC
HCT: 30.1 % — ABNORMAL LOW (ref 35.0–47.0)
Hemoglobin: 9.9 g/dL — ABNORMAL LOW (ref 12.0–16.0)
MCH: 29.8 pg (ref 26.0–34.0)
MCHC: 32.9 g/dL (ref 32.0–36.0)
MCV: 90.5 fL (ref 80.0–100.0)
PLATELETS: 177 10*3/uL (ref 150–440)
RBC: 3.32 MIL/uL — ABNORMAL LOW (ref 3.80–5.20)
RDW: 14.4 % (ref 11.5–14.5)
WBC: 4.8 10*3/uL (ref 3.6–11.0)

## 2015-01-29 LAB — LIPASE, BLOOD: LIPASE: 55 U/L — AB (ref 22–51)

## 2015-01-29 NOTE — Plan of Care (Signed)
Problem: Discharge Progression Outcomes Goal: Hemodynamically stable Outcome: Progressing Lipase down  From 601 to 55

## 2015-01-29 NOTE — Discharge Instructions (Signed)
DIET:  Regular diet. Eat lightly for the next few days. Avoid fatty foods.  DISCHARGE CONDITION:  Stable  ACTIVITY:  Activity as tolerated  OXYGEN:  Home Oxygen: No.   Oxygen Delivery: room air  DISCHARGE LOCATION:  home   If you experience worsening of your admission symptoms, develop shortness of breath, life threatening emergency, suicidal or homicidal thoughts you must seek medical attention immediately by calling 911 or calling your MD immediately  if symptoms less severe.  You Must read complete instructions/literature along with all the possible adverse reactions/side effects for all the Medicines you take and that have been prescribed to you. Take any new Medicines after you have completely understood and accpet all the possible adverse reactions/side effects.   Please note  You were cared for by a hospitalist during your hospital stay. If you have any questions about your discharge medications or the care you received while you were in the hospital after you are discharged, you can call the unit and asked to speak with the hospitalist on call if the hospitalist that took care of you is not available. Once you are discharged, your primary care physician will handle any further medical issues. Please note that NO REFILLS for any discharge medications will be authorized once you are discharged, as it is imperative that you return to your primary care physician (or establish a relationship with a primary care physician if you do not have one) for your aftercare needs so that they can reassess your need for medications and monitor your lab values.      Acute Pancreatitis Acute pancreatitis is a disease in which the pancreas becomes suddenly inflamed. The pancreas is a large gland located behind your stomach. The pancreas produces enzymes that help digest food. The pancreas also releases the hormones glucagon and insulin that help regulate blood sugar. Damage to the pancreas occurs  when the digestive enzymes from the pancreas are activated and begin attacking the pancreas before being released into the intestine. Most acute attacks last a couple of days and can cause serious complications. Some people become dehydrated and develop low blood pressure. In severe cases, bleeding into the pancreas can lead to shock and can be life-threatening. The lungs, heart, and kidneys may fail. CAUSES  Pancreatitis can happen to anyone. In some cases, the cause is unknown. Most cases are caused by:  Alcohol abuse.  Gallstones. Other less common causes are:  Certain medicines.  Exposure to certain chemicals.  Infection.  Damage caused by an accident (trauma).  Abdominal surgery. SYMPTOMS   Pain in the upper abdomen that may radiate to the back.  Tenderness and swelling of the abdomen.  Nausea and vomiting. DIAGNOSIS  Your caregiver will perform a physical exam. Blood and stool tests may be done to confirm the diagnosis. Imaging tests may also be done, such as X-rays, CT scans, or an ultrasound of the abdomen. TREATMENT  Treatment usually requires a stay in the hospital. Treatment may include:  Pain medicine.  Fluid replacement through an intravenous line (IV).  Placing a tube in the stomach to remove stomach contents and control vomiting.  Not eating for 3 or 4 days. This gives your pancreas a rest, because enzymes are not being produced that can cause further damage.  Antibiotic medicines if your condition is caused by an infection.  Surgery of the pancreas or gallbladder. HOME CARE INSTRUCTIONS   Follow the diet advised by your caregiver. This may involve avoiding alcohol and decreasing the amount  of fat in your diet.  Eat smaller, more frequent meals. This reduces the amount of digestive juices the pancreas produces.  Drink enough fluids to keep your urine clear or pale yellow.  Only take over-the-counter or prescription medicines as directed by your  caregiver.  Avoid drinking alcohol if it caused your condition.  Do not smoke.  Get plenty of rest.  Check your blood sugar at home as directed by your caregiver.  Keep all follow-up appointments as directed by your caregiver. SEEK MEDICAL CARE IF:   You do not recover as quickly as expected.  You develop new or worsening symptoms.  You have persistent pain, weakness, or nausea.  You recover and then have another episode of pain. SEEK IMMEDIATE MEDICAL CARE IF:   You are unable to eat or keep fluids down.  Your pain becomes severe.  You have a fever or persistent symptoms for more than 2 to 3 days.  You have a fever and your symptoms suddenly get worse.  Your skin or the white part of your eyes turn yellow (jaundice).  You develop vomiting.  You feel dizzy, or you faint.  Your blood sugar is high (over 300 mg/dL). MAKE SURE YOU:   Understand these instructions.  Will watch your condition.  Will get help right away if you are not doing well or get worse. Document Released: 05/18/2005 Document Revised: 11/17/2011 Document Reviewed: 08/27/2011 [facility] Patient Information 2015 [place], [place]. This information is not intended to replace advice given to you by your health care provider. Make sure you discuss any questions you have with your health care provider.  Acute Pancreatitis Acute pancreatitis is a disease in which the pancreas becomes suddenly inflamed. The pancreas is a large gland located behind your stomach. The pancreas produces enzymes that help digest food. The pancreas also releases the hormones glucagon and insulin that help regulate blood sugar. Damage to the pancreas occurs when the digestive enzymes from the pancreas are activated and begin attacking the pancreas before being released into the intestine. Most acute attacks last a couple of days and can cause serious complications. Some people become dehydrated and develop low blood pressure. In severe  cases, bleeding into the pancreas can lead to shock and can be life-threatening. The lungs, heart, and kidneys may fail. CAUSES  Pancreatitis can happen to anyone. In some cases, the cause is unknown. Most cases are caused by:  Alcohol abuse.  Gallstones. Other less common causes are:  Certain medicines.  Exposure to certain chemicals.  Infection.  Damage caused by an accident (trauma).  Abdominal surgery. SYMPTOMS   Pain in the upper abdomen that may radiate to the back.  Tenderness and swelling of the abdomen.  Nausea and vomiting. DIAGNOSIS  Your caregiver will perform a physical exam. Blood and stool tests may be done to confirm the diagnosis. Imaging tests may also be done, such as X-rays, CT scans, or an ultrasound of the abdomen. TREATMENT  Treatment usually requires a stay in the hospital. Treatment may include:  Pain medicine.  Fluid replacement through an intravenous line (IV).  Placing a tube in the stomach to remove stomach contents and control vomiting.  Not eating for 3 or 4 days. This gives your pancreas a rest, because enzymes are not being produced that can cause further damage.  Antibiotic medicines if your condition is caused by an infection.  Surgery of the pancreas or gallbladder. HOME CARE INSTRUCTIONS   Follow the diet advised by your caregiver. This may  involve avoiding alcohol and decreasing the amount of fat in your diet.  Eat smaller, more frequent meals. This reduces the amount of digestive juices the pancreas produces.  Drink enough fluids to keep your urine clear or pale yellow.  Only take over-the-counter or prescription medicines as directed by your caregiver.  Avoid drinking alcohol if it caused your condition.  Do not smoke.  Get plenty of rest.  Check your blood sugar at home as directed by your caregiver.  Keep all follow-up appointments as directed by your caregiver. SEEK MEDICAL CARE IF:   You do not recover as  quickly as expected.  You develop new or worsening symptoms.  You have persistent pain, weakness, or nausea.  You recover and then have another episode of pain. SEEK IMMEDIATE MEDICAL CARE IF:   You are unable to eat or keep fluids down.  Your pain becomes severe.  You have a fever or persistent symptoms for more than 2 to 3 days.  You have a fever and your symptoms suddenly get worse.  Your skin or the white part of your eyes turn yellow (jaundice).  You develop vomiting.  You feel dizzy, or you faint.  Your blood sugar is high (over 300 mg/dL). MAKE SURE YOU:   Understand these instructions.  Will watch your condition.  Will get help right away if you are not doing well or get worse. Document Released: 05/18/2005 Document Revised: 11/17/2011 Document Reviewed: 08/27/2011 [facility] Patient Information 2015 [place], [place]. This information is not intended to replace advice given to you by your health care provider. Make sure you discuss any questions you have with your health care provider.

## 2015-01-29 NOTE — Progress Notes (Signed)
Initial Nutrition Assessment   INTERVENTION:   Meals and Snacks: Cater to patient preferences Education: RD provided "Pancreatitis Nutrition Therapy" handout from the [employer]. Discussed nutrition therapy to reduce the irritation of the pancreas to promote digestion and absorption of nutrients. Provided list of recommended low fat foods as well as a recommended sample day. Teach back method used.  Expect good compliance as pt does not typically eat high fat foods or alcohol PTA.   NUTRITION DIAGNOSIS:   Inadequate oral intake related to acute illness as evidenced by  (currently on FL diet order).  GOAL:   Patient will meet greater than or equal to 90% of their needs  MONITOR:    (Energy Intake, Gastrointestinal Profile)  REASON FOR ASSESSMENT:   Diagnosis    ASSESSMENT:   Pt admitted with acute on chronic pancreatitis.  Past Medical History  Diagnosis Date  . Pancreatitis   . Diverticulosis   . Cholelithiasis   . Fibrocystic disease of breast      Diet Order:  Diet full liquid Room service appropriate?: Yes; Fluid consistency:: Thin    Current Nutrition: Pt tolerated FL this am.  Food/Nutrition-Related History: Pt reports poor appetite for a few days PTA but usually has a good appetite eating 3 healthy meals per day.   Medications: NS at 148mL/hr, Protonix, Colace  Electrolyte/Renal Profile and Glucose Profile:   Recent Labs Lab 01/28/15 1128  NA 140  K 3.9  CL 107  CO2 26  BUN 20  CREATININE 0.60  CALCIUM 9.3  GLUCOSE 94   Protein Profile:  Recent Labs Lab 01/28/15 1128  ALBUMIN 3.9   Lipase     Component Value Date/Time   LIPASE 55* 01/29/2015 0421  Lipase 601 on admission.  Gastrointestinal Profile: Last BM: 01/28/2015   Weight Change: Per MST no decrease in weight PTA  Height:   Ht Readings from Last 1 Encounters:  01/28/15 5\' 3"  (1.6 m)    Weight:   Wt Readings from Last 1 Encounters:  01/28/15 160  lb (72.576 kg)     BMI:  Body mass index is 28.35 kg/(m^2).   EDUCATION NEEDS:   Education needs addressed   LOW Care Level  Dwyane Luo, [place], LDN Pager [phone]

## 2015-01-29 NOTE — Plan of Care (Signed)
Problem: Discharge Progression Outcomes Goal: Other Discharge Outcomes/Goals Plan of care progress to goal: - Complained of pain, norco given with improvement. - No nausea or vomiting noted this shift. - Ambulates in room. - Continues IV fluids. - Will continue to monitor.

## 2015-01-29 NOTE — Plan of Care (Signed)
Problem: Discharge Progression Outcomes Goal: Complications resolved/controlled Outcome: Progressing Lipase  Decreased to 50, pt to discharge home after lunch time . Son will pick her up

## 2015-01-31 NOTE — Discharge Summary (Signed)
[facility] [place]   PATIENT NAME: Felicia Daniels    MR#:  [id number]  DATE OF BIRTH:  [birth year]  DATE OF ADMISSION:  01/28/2015 ADMITTING PHYSICIAN: Hillary Bow, MD  DATE OF DISCHARGE: 01/29/2015  PRIMARY CARE PHYSICIAN: Kirk Ruths., MD    ADMISSION DIAGNOSIS:  Other acute pancreatitis [K85.8]  DISCHARGE DIAGNOSIS:  Active Problems:   Acute pancreatitis  SECONDARY DIAGNOSIS:   Past Medical History  Diagnosis Date  . Pancreatitis   . Diverticulosis   . Cholelithiasis   . Fibrocystic disease of breast     HOSPITAL COURSE:   #1 acute on chronic pancreatitis: Presented with colicky type abdominal pain lipase elevated at 600 CT scan of the abdomen showing chronic pancreatitis. She was started on IV fluids and IV pain medication. The next morning her lipase is down to 50 and she is slightly sore but pain is minimal. She is tolerating a soft diet. I have discussed her recurrent pancreatitis with her gastroenterologist. She will follow up with him as an outpatient. She is advised to maintain a light diet for the next few days and avoid fatty foods. She is advised to drink plenty of fluids and remain hydrated.  DISCHARGE CONDITIONS:   Stable  CONSULTS OBTAINED:     None  DRUG ALLERGIES:   Allergies  Allergen Reactions  . Keflex [Cephalexin] Rash  . Penicillins Rash    DISCHARGE MEDICATIONS:   Discharge Medication List as of 01/29/2015 12:09 PM    CONTINUE these medications which have NOT CHANGED   Details  Cholecalciferol (VITAMIN D3) 1000 UNITS CAPS Take 1 tablet by mouth daily., Until Discontinued, Historical Med    Multiple Vitamin (MULTI-VITAMINS) TABS Take 1 tablet by mouth daily., Until Discontinued, Historical Med    omeprazole (PRILOSEC) 20 MG capsule Take 1 capsule by mouth daily., Starting 01/07/2015, Until Discontinued, Historical Med    ursodiol (ACTIGALL) 300 MG capsule Take 1  capsule by mouth 2 (two) times daily., Starting 10/10/2014, Until Discontinued, Historical Med      STOP taking these medications     calcium carbonate (OS-CAL - DOSED IN MG OF ELEMENTAL CALCIUM) 1250 (500 CA) MG tablet          DISCHARGE INSTRUCTIONS:   See AVS  Today   CHIEF COMPLAINT:   Chief Complaint  Patient presents with  . Abdominal Pain    HISTORY OF PRESENT ILLNESS:  Felicia Daniels is a 74 y.o. female with a known history of pancreatitis presents to the hospital complaining of abdominal pain radiating to the back. This pain started yesterday night she has had severe nausea and dry heaving but no vomiting. No diarrhea or constipation. Afebrile.. In the emergency room patient has been found to have elevated lipase of 600 and is being admitted for acute on chronic pancreatitis. CT scan of the abdomen shows calcified chronic pancreatic changes. Patient has had recurrent pancreatitis in the past including multiple ERCPs and cholecystectomy. Her last episode was 3-4 years back. She doesn't drink alcohol. Patient continues to have significant abdominal pain and nausea and is being admitted to the hospital.  VITAL SIGNS:  Blood pressure 128/63, pulse 66, temperature 98.2 F (36.8 C), temperature source Oral, resp. rate 20, height 5\' 3"  (1.6 m), weight 72.576 kg (160 lb), SpO2 99 %.  I/O:  No intake or output data in the 24 hours ending 01/31/15 1400  PHYSICAL EXAMINATION:  GENERAL:  74 y.o.-year-old patient lying in the bed  with no acute distress.  LUNGS: Normal breath sounds bilaterally, no wheezing, rales,rhonchi or crepitation. No use of accessory muscles of respiration.  CARDIOVASCULAR: S1, S2 normal. No murmurs, rubs, or gallops.  ABDOMEN: Soft, very slight tenderness with palpation in the mid epigastric area, non-distended. Bowel sounds present. No organomegaly or mass.  EXTREMITIES: No pedal edema, cyanosis, or clubbing.  NEUROLOGIC: Cranial nerves II through XII are  intact. Muscle strength 5/5 in all extremities. Sensation intact. Gait not checked.  PSYCHIATRIC: The patient is alert and oriented x 3.  SKIN: No obvious rash, lesion, or ulcer.   DATA REVIEW:   CBC  Recent Labs Lab 01/29/15 0421  WBC 4.8  HGB 9.9*  HCT 30.1*  PLT 177    Chemistries   Recent Labs Lab 01/28/15 1128  NA 140  K 3.9  CL 107  CO2 26  GLUCOSE 94  BUN 20  CREATININE 0.60  CALCIUM 9.3  AST 39  ALT 34  ALKPHOS 241*  BILITOT 0.7    Cardiac Enzymes No results for input(s): TROPONINI in the last 168 hours.  Microbiology Results  No results found for this or any previous visit.  RADIOLOGY:  No results found.  EKG:   Orders placed or performed during the hospital encounter of 01/28/15  . ED EKG  . ED EKG  . EKG 12-Lead  . EKG 12-Lead  . EKG      Management plans discussed with the patient, family and they are in agreement.  CODE STATUS:  Advance Directive Documentation        Most Recent Value   Type of Advance Directive  Healthcare Power of Attorney, Living will   Pre-existing out of facility DNR order (yellow form or pink MOST form)     "MOST" Form in Place?        TOTAL TIME TAKING CARE OF THIS PATIENT: 35 minutes.  Greater than 50% of time spent in care coordination and counseling.  Myrtis Ser M.D on 01/31/2015 at 2:00 PM  Between 7am to 6pm - Pager - [phone]  After 6pm go to [email or web address] - password [email or web address] [facility]  Office  [phone]  CC: Primary care physician; Kirk Ruths., MD

## 2015-02-08 ENCOUNTER — Encounter: Payer: Self-pay | Admitting: *Deleted

## 2015-02-08 DIAGNOSIS — Z803 Family history of malignant neoplasm of breast: Secondary | ICD-10-CM

## 2015-02-08 DIAGNOSIS — K8020 Calculus of gallbladder without cholecystitis without obstruction: Secondary | ICD-10-CM | POA: Diagnosis present

## 2015-02-08 DIAGNOSIS — K861 Other chronic pancreatitis: Secondary | ICD-10-CM | POA: Diagnosis present

## 2015-02-08 DIAGNOSIS — K5790 Diverticulosis of intestine, part unspecified, without perforation or abscess without bleeding: Secondary | ICD-10-CM | POA: Diagnosis present

## 2015-02-08 DIAGNOSIS — Z808 Family history of malignant neoplasm of other organs or systems: Secondary | ICD-10-CM

## 2015-02-08 DIAGNOSIS — Z90710 Acquired absence of both cervix and uterus: Secondary | ICD-10-CM

## 2015-02-08 DIAGNOSIS — Z888 Allergy status to other drugs, medicaments and biological substances status: Secondary | ICD-10-CM

## 2015-02-08 DIAGNOSIS — Z79899 Other long term (current) drug therapy: Secondary | ICD-10-CM

## 2015-02-08 DIAGNOSIS — K850 Idiopathic acute pancreatitis: Principal | ICD-10-CM | POA: Diagnosis present

## 2015-02-08 DIAGNOSIS — Z880 Allergy status to penicillin: Secondary | ICD-10-CM

## 2015-02-08 DIAGNOSIS — Z9889 Other specified postprocedural states: Secondary | ICD-10-CM

## 2015-02-08 DIAGNOSIS — R748 Abnormal levels of other serum enzymes: Secondary | ICD-10-CM | POA: Diagnosis present

## 2015-02-08 DIAGNOSIS — K219 Gastro-esophageal reflux disease without esophagitis: Secondary | ICD-10-CM | POA: Diagnosis present

## 2015-02-08 DIAGNOSIS — M810 Age-related osteoporosis without current pathological fracture: Secondary | ICD-10-CM | POA: Diagnosis present

## 2015-02-08 LAB — CBC WITH DIFFERENTIAL/PLATELET
BASOS ABS: 0.1 10*3/uL (ref 0–0.1)
Basophils Relative: 1 %
EOS ABS: 0.1 10*3/uL (ref 0–0.7)
EOS PCT: 1 %
HCT: 36.3 % (ref 35.0–47.0)
Hemoglobin: 12.3 g/dL (ref 12.0–16.0)
Lymphocytes Relative: 43 %
Lymphs Abs: 2.9 10*3/uL (ref 1.0–3.6)
MCH: 29.8 pg (ref 26.0–34.0)
MCHC: 33.8 g/dL (ref 32.0–36.0)
MCV: 88.3 fL (ref 80.0–100.0)
Monocytes Absolute: 0.7 10*3/uL (ref 0.2–0.9)
Monocytes Relative: 10 %
Neutro Abs: 3 10*3/uL (ref 1.4–6.5)
Neutrophils Relative %: 45 %
PLATELETS: 337 10*3/uL (ref 150–440)
RBC: 4.11 MIL/uL (ref 3.80–5.20)
RDW: 13.6 % (ref 11.5–14.5)
WBC: 6.8 10*3/uL (ref 3.6–11.0)

## 2015-02-08 LAB — URINALYSIS COMPLETE WITH MICROSCOPIC (ARMC ONLY)
BILIRUBIN URINE: NEGATIVE
GLUCOSE, UA: NEGATIVE mg/dL
Hgb urine dipstick: NEGATIVE
KETONES UR: NEGATIVE mg/dL
LEUKOCYTES UA: NEGATIVE
Nitrite: NEGATIVE
PH: 5 (ref 5.0–8.0)
Protein, ur: NEGATIVE mg/dL
Specific Gravity, Urine: 1.01 (ref 1.005–1.030)

## 2015-02-08 NOTE — ED Notes (Signed)
Pt has abd pain.Marland Kitchen  Hx pancreatitis.  Pt saw doctor today and was told liver enzymes were elevated.  No n/v/d.  No etoh use.

## 2015-02-09 ENCOUNTER — Inpatient Hospital Stay
Admission: EM | Admit: 2015-02-09 | Discharge: 2015-02-10 | DRG: 440 | Disposition: A | Discharge status: Home / Self Care | Payer: Medicare Other | Attending: Specialist | Admitting: Specialist

## 2015-02-09 ENCOUNTER — Encounter: Payer: Self-pay | Admitting: Emergency Medicine

## 2015-02-09 DIAGNOSIS — Z9889 Other specified postprocedural states: Secondary | ICD-10-CM | POA: Diagnosis not present

## 2015-02-09 DIAGNOSIS — K859 Acute pancreatitis without necrosis or infection, unspecified: Secondary | ICD-10-CM | POA: Diagnosis present

## 2015-02-09 DIAGNOSIS — K8500 Idiopathic acute pancreatitis without necrosis or infection: Secondary | ICD-10-CM

## 2015-02-09 DIAGNOSIS — Z90710 Acquired absence of both cervix and uterus: Secondary | ICD-10-CM | POA: Diagnosis not present

## 2015-02-09 DIAGNOSIS — Z880 Allergy status to penicillin: Secondary | ICD-10-CM | POA: Diagnosis not present

## 2015-02-09 DIAGNOSIS — R748 Abnormal levels of other serum enzymes: Secondary | ICD-10-CM | POA: Diagnosis present

## 2015-02-09 DIAGNOSIS — K5790 Diverticulosis of intestine, part unspecified, without perforation or abscess without bleeding: Secondary | ICD-10-CM | POA: Diagnosis present

## 2015-02-09 DIAGNOSIS — R1013 Epigastric pain: Secondary | ICD-10-CM

## 2015-02-09 DIAGNOSIS — Z808 Family history of malignant neoplasm of other organs or systems: Secondary | ICD-10-CM | POA: Diagnosis not present

## 2015-02-09 DIAGNOSIS — K219 Gastro-esophageal reflux disease without esophagitis: Secondary | ICD-10-CM | POA: Diagnosis present

## 2015-02-09 DIAGNOSIS — Z803 Family history of malignant neoplasm of breast: Secondary | ICD-10-CM | POA: Diagnosis not present

## 2015-02-09 DIAGNOSIS — Z79899 Other long term (current) drug therapy: Secondary | ICD-10-CM | POA: Diagnosis not present

## 2015-02-09 DIAGNOSIS — K861 Other chronic pancreatitis: Secondary | ICD-10-CM | POA: Diagnosis present

## 2015-02-09 DIAGNOSIS — M810 Age-related osteoporosis without current pathological fracture: Secondary | ICD-10-CM | POA: Diagnosis present

## 2015-02-09 DIAGNOSIS — K8020 Calculus of gallbladder without cholecystitis without obstruction: Secondary | ICD-10-CM | POA: Diagnosis present

## 2015-02-09 DIAGNOSIS — Z888 Allergy status to other drugs, medicaments and biological substances status: Secondary | ICD-10-CM | POA: Diagnosis not present

## 2015-02-09 LAB — HEMOGLOBIN A1C: Hgb A1c MFr Bld: 5.4 % (ref 4.0–6.0)

## 2015-02-09 LAB — LIPASE, BLOOD: LIPASE: 366 U/L — AB (ref 22–51)

## 2015-02-09 LAB — COMPREHENSIVE METABOLIC PANEL
ALT: 29 U/L (ref 14–54)
ANION GAP: 6 (ref 5–15)
AST: 36 U/L (ref 15–41)
Albumin: 3.9 g/dL (ref 3.5–5.0)
Alkaline Phosphatase: 230 U/L — ABNORMAL HIGH (ref 38–126)
BUN: 18 mg/dL (ref 6–20)
CALCIUM: 9.6 mg/dL (ref 8.9–10.3)
CHLORIDE: 107 mmol/L (ref 101–111)
CO2: 28 mmol/L (ref 22–32)
CREATININE: 0.75 mg/dL (ref 0.44–1.00)
Glucose, Bld: 111 mg/dL — ABNORMAL HIGH (ref 65–99)
Potassium: 3.9 mmol/L (ref 3.5–5.1)
SODIUM: 141 mmol/L (ref 135–145)
Total Bilirubin: 0.6 mg/dL (ref 0.3–1.2)
Total Protein: 7.8 g/dL (ref 6.5–8.1)

## 2015-02-09 MED ORDER — ONDANSETRON HCL 4 MG/2ML IJ SOLN
4.0000 mg | Freq: Once | INTRAMUSCULAR | Status: AC
  Administered 2015-02-09: 4 mg via INTRAVENOUS
  Filled 2015-02-09: qty 2

## 2015-02-09 MED ORDER — SODIUM CHLORIDE 0.9 % IV BOLUS (SEPSIS)
1000.0000 mL | Freq: Once | INTRAVENOUS | Status: AC
  Administered 2015-02-09: 1000 mL via INTRAVENOUS

## 2015-02-09 MED ORDER — ONDANSETRON HCL 4 MG/2ML IJ SOLN
4.0000 mg | Freq: Four times a day (QID) | INTRAMUSCULAR | Status: DC | PRN
  Administered 2015-02-09: 4 mg via INTRAVENOUS
  Filled 2015-02-09: qty 2

## 2015-02-09 MED ORDER — SODIUM CHLORIDE 0.9 % IV SOLN
INTRAVENOUS | Status: DC
  Administered 2015-02-09 – 2015-02-10 (×4): via INTRAVENOUS

## 2015-02-09 MED ORDER — URSODIOL 300 MG PO CAPS
300.0000 mg | ORAL_CAPSULE | Freq: Two times a day (BID) | ORAL | Status: DC
  Administered 2015-02-09 – 2015-02-10 (×3): 300 mg via ORAL
  Filled 2015-02-09 (×4): qty 1

## 2015-02-09 MED ORDER — HYDROMORPHONE HCL 1 MG/ML IJ SOLN
0.5000 mg | INTRAMUSCULAR | Status: AC | PRN
  Administered 2015-02-09 (×2): 0.5 mg via INTRAVENOUS
  Filled 2015-02-09 (×2): qty 1

## 2015-02-09 MED ORDER — ACETAMINOPHEN 650 MG RE SUPP: 650.0000 mg | Freq: Four times a day (QID) | RECTAL | Status: DC | PRN

## 2015-02-09 MED ORDER — ONDANSETRON HCL 4 MG PO TABS: 4.0000 mg | ORAL_TABLET | Freq: Four times a day (QID) | ORAL | Status: DC | PRN

## 2015-02-09 MED ORDER — PANTOPRAZOLE SODIUM 40 MG PO TBEC
40.0000 mg | DELAYED_RELEASE_TABLET | Freq: Every day | ORAL | Status: DC
  Administered 2015-02-09 – 2015-02-10 (×2): 40 mg via ORAL
  Filled 2015-02-09 (×2): qty 1

## 2015-02-09 MED ORDER — DOCUSATE SODIUM 100 MG PO CAPS
100.0000 mg | ORAL_CAPSULE | Freq: Two times a day (BID) | ORAL | Status: DC
  Administered 2015-02-09 – 2015-02-10 (×3): 100 mg via ORAL
  Filled 2015-02-09 (×3): qty 1

## 2015-02-09 MED ORDER — HYDROMORPHONE HCL 1 MG/ML IJ SOLN: 0.5000 mg | INTRAMUSCULAR | Status: DC | PRN

## 2015-02-09 MED ORDER — HEPARIN SODIUM (PORCINE) 5000 UNIT/ML IJ SOLN
5000.0000 [IU] | Freq: Three times a day (TID) | INTRAMUSCULAR | Status: DC
  Administered 2015-02-09 – 2015-02-10 (×3): 5000 [IU] via SUBCUTANEOUS
  Filled 2015-02-09 (×3): qty 1

## 2015-02-09 MED ORDER — VITAMIN D 1000 UNITS PO TABS
1000.0000 [IU] | ORAL_TABLET | Freq: Every day | ORAL | Status: DC
  Administered 2015-02-09 – 2015-02-10 (×2): 1000 [IU] via ORAL
  Filled 2015-02-09 (×2): qty 1

## 2015-02-09 MED ORDER — ACETAMINOPHEN 325 MG PO TABS
650.0000 mg | ORAL_TABLET | Freq: Four times a day (QID) | ORAL | Status: DC | PRN
  Administered 2015-02-09 – 2015-02-10 (×2): 650 mg via ORAL
  Filled 2015-02-09 (×3): qty 2

## 2015-02-09 MED ORDER — ADULT MULTIVITAMIN W/MINERALS CH
1.0000 | ORAL_TABLET | Freq: Every day | ORAL | Status: DC
  Administered 2015-02-09 – 2015-02-10 (×2): 1 via ORAL
  Filled 2015-02-09 (×3): qty 1

## 2015-02-09 NOTE — Progress Notes (Signed)
Initial Nutrition Assessment   INTERVENTION:   Coordination of care: await diet progression Medical Food Supplement Therapy: will recommend on follow if diet order unable to be advanced   NUTRITION DIAGNOSIS:   Inadequate oral intake related to acute illness as evidenced by  (NPO/CL diet order since admission).  GOAL:   Patient will meet greater than or equal to 90% of their needs  MONITOR:    (Energy Intake, Gastrointestinal Profile)  REASON FOR ASSESSMENT:   Diagnosis    ASSESSMENT:   Pt admitted with acute on chronic pancreatitis.  Past Medical History  Diagnosis Date  . Pancreatitis   . Diverticulosis   . Cholelithiasis   . Fibrocystic disease of breast      Diet Order:  Diet clear liquid Room service appropriate?: Yes; Fluid consistency:: Thin    Current Nutrition: Pt NPO this am on visit. Pt currently with nausea.  Food/Nutrition-Related History: Pt reports eating chicken noodle soup and juice last night PTA. RD familiar with pt on last admission last week, whereupon pt reported eating very low fat foods on a regular basis and does not drink alcohol. Per MST pt with decreased appetite PTA.    Medications: NS at 170mL/hr, Protonix, MVI, colace, vitamin D, dilaudid, Zofran  Electrolyte/Renal Profile and Glucose Profile:   Recent Labs Lab 02/08/15 2334  NA 141  K 3.9  CL 107  CO2 28  BUN 18  CREATININE 0.75  CALCIUM 9.6  GLUCOSE 111*   Protein Profile:  Recent Labs Lab 02/08/15 2334  ALBUMIN 3.9   Lipase     Component Value Date/Time   LIPASE 366* 02/08/2015 2334     Gastrointestinal Profile: Last BM:  02/08/2015   Weight Change: Pt weight stable since last admission.    Height:   Ht Readings from Last 1 Encounters:  02/09/15 5\' 3"  (1.6 m)    Weight:   Wt Readings from Last 1 Encounters:  02/09/15 159 lb 11.2 oz (72.439 kg)   Wt Readings from Last 10 Encounters:  02/09/15 159 lb 11.2 oz (72.439 kg)  01/28/15 160 lb  (72.576 kg)    BMI:  Body mass index is 28.3 kg/(m^2).   EDUCATION NEEDS:   No education needs identified at this time. RD provided education on last visit on 01/29/2015 on Low fat nutrition therapy for pancreatitis.   LOW Care Level  Dwyane Luo, [place], [place] Pager [phone]

## 2015-02-09 NOTE — H&P (Signed)
Felicia Daniels is an 74 y.o. female.   Chief Complaint: Abdominal pain HPI: The patient presents emergency department complaining of midepigastric pain as well as worsening nausea 24 hours. She denies vomiting. The patient has past medical history significant for pancreatitis. In the emergency department she was found to have elevated lipase which prompted the emergency department staff to call for admission.  Past Medical History  Diagnosis Date  . Pancreatitis   . Diverticulosis   . Cholelithiasis   . Fibrocystic disease of breast     Past Surgical History  Procedure Laterality Date  . Meniscus repair Right   . Abdominal hysterectomy      Family History  Problem Relation Age of Onset  . Skin cancer Mother   . Breast cancer Daughter    Social History:  reports that she has never smoked. She does not have any smokeless tobacco history on file. She reports that she does not drink alcohol. Her drug history is not on file.  Allergies:  Allergies  Allergen Reactions  . Synthroid [Levothyroxine Sodium] Shortness Of Breath and Other (See Comments)    Reaction: like she was having a heart attack. Sweats, nausea, felt like she couldn't breath.  . Keflex [Cephalexin] Rash  . Penicillins Rash    Medications Prior to Admission  Medication Sig Dispense Refill  . Cholecalciferol (VITAMIN D3) 1000 UNITS CAPS Take 1 tablet by mouth daily.    . Multiple Vitamin (MULTI-VITAMINS) TABS Take 1 tablet by mouth daily.    Marland Kitchen omeprazole (PRILOSEC) 20 MG capsule Take 1 capsule by mouth daily.    . ursodiol (ACTIGALL) 300 MG capsule Take 1 capsule by mouth 2 (two) times daily.      Results for orders placed or performed during the hospital encounter of 02/09/15 (from the past 48 hour(s))  Comprehensive metabolic panel     Status: Abnormal   Collection Time: 02/08/15 11:34 PM  Result Value Ref Range   Sodium 141 135 - 145 mmol/L   Potassium 3.9 3.5 - 5.1 mmol/L   Chloride 107 101 - 111 mmol/L    CO2 28 22 - 32 mmol/L   Glucose, Bld 111 (H) 65 - 99 mg/dL   BUN 18 6 - 20 mg/dL   Creatinine, Ser 0.75 0.44 - 1.00 mg/dL   Calcium 9.6 8.9 - 10.3 mg/dL   Total Protein 7.8 6.5 - 8.1 g/dL   Albumin 3.9 3.5 - 5.0 g/dL   AST 36 15 - 41 U/L   ALT 29 14 - 54 U/L   Alkaline Phosphatase 230 (H) 38 - 126 U/L   Total Bilirubin 0.6 0.3 - 1.2 mg/dL   GFR calc non Af Amer >60 >60 mL/min   GFR calc Af Amer >60 >60 mL/min    Comment: (NOTE) The eGFR has been calculated using the CKD EPI equation. This calculation has not been validated in all clinical situations. eGFR's persistently <60 mL/min signify possible Chronic Kidney Disease.    Anion gap 6 5 - 15  Lipase, blood     Status: Abnormal   Collection Time: 02/08/15 11:34 PM  Result Value Ref Range   Lipase 366 (H) 22 - 51 U/L  CBC WITH DIFFERENTIAL     Status: None   Collection Time: 02/08/15 11:34 PM  Result Value Ref Range   WBC 6.8 3.6 - 11.0 K/uL   RBC 4.11 3.80 - 5.20 MIL/uL   Hemoglobin 12.3 12.0 - 16.0 g/dL   HCT 36.3 35.0 - 47.0 %  MCV 88.3 80.0 - 100.0 fL   MCH 29.8 26.0 - 34.0 pg   MCHC 33.8 32.0 - 36.0 g/dL   RDW 13.6 11.5 - 14.5 %   Platelets 337 150 - 440 K/uL   Neutrophils Relative % 45 %   Neutro Abs 3.0 1.4 - 6.5 K/uL   Lymphocytes Relative 43 %   Lymphs Abs 2.9 1.0 - 3.6 K/uL   Monocytes Relative 10 %   Monocytes Absolute 0.7 0.2 - 0.9 K/uL   Eosinophils Relative 1 %   Eosinophils Absolute 0.1 0 - 0.7 K/uL   Basophils Relative 1 %   Basophils Absolute 0.1 0 - 0.1 K/uL  Urinalysis complete, with microscopic ([facility] only)     Status: Abnormal   Collection Time: 02/08/15 11:34 PM  Result Value Ref Range   Color, Urine YELLOW (A) YELLOW   APPearance CLEAR (A) CLEAR   Glucose, UA NEGATIVE NEGATIVE mg/dL   Bilirubin Urine NEGATIVE NEGATIVE   Ketones, ur NEGATIVE NEGATIVE mg/dL   Specific Gravity, Urine 1.010 1.005 - 1.030   Hgb urine dipstick NEGATIVE NEGATIVE   pH 5.0 5.0 - 8.0   Protein, ur NEGATIVE  NEGATIVE mg/dL   Nitrite NEGATIVE NEGATIVE   Leukocytes, UA NEGATIVE NEGATIVE   RBC / HPF 0-5 0 - 5 RBC/hpf   WBC, UA 0-5 0 - 5 WBC/hpf   Bacteria, UA MANY (A) NONE SEEN   Squamous Epithelial / LPF 0-5 (A) NONE SEEN   Mucous PRESENT    No results found.  Review of Systems  Constitutional: Negative for fever and chills.  HENT: Negative for sore throat and tinnitus.   Eyes: Negative for blurred vision and redness.  Respiratory: Negative for cough and shortness of breath.   Cardiovascular: Negative for chest pain, palpitations, orthopnea and PND.  Gastrointestinal: Positive for nausea, vomiting and abdominal pain. Negative for diarrhea.  Genitourinary: Negative for dysuria, urgency and frequency.  Musculoskeletal: Negative for myalgias and joint pain.  Skin: Negative for rash.       No lesions  Neurological: Negative for speech change, focal weakness and weakness.  Endo/Heme/Allergies: Does not bruise/bleed easily.       No temperature intolerance  Psychiatric/Behavioral: Negative for depression and suicidal ideas.    Blood pressure 143/46, pulse 64, temperature 97.9 F (36.6 C), temperature source Oral, resp. rate 16, height _0  (1.6 m), weight 72.439 kg (159 lb 11.2 oz), SpO2 99 %. Physical Exam  Nursing note and vitals reviewed. Constitutional: She is oriented to person, place, and time. She appears well-developed and well-nourished.  HENT:  Head: Normocephalic and atraumatic.  Mouth/Throat: Oropharynx is clear and moist.  Eyes: Conjunctivae and EOM are normal. Pupils are equal, round, and reactive to light. No scleral icterus.  Neck: Normal range of motion. Neck supple. No JVD present. No tracheal deviation present. No thyromegaly present.  Cardiovascular: Normal rate, regular rhythm and normal heart sounds.  Exam reveals no gallop and no friction rub.   No murmur heard. Respiratory: Effort normal and breath sounds normal.  GI: Soft. Bowel sounds are normal. She exhibits  no distension. There is tenderness. There is no rebound and no guarding.  Genitourinary:  Deferred  Musculoskeletal: Normal range of motion. She exhibits no edema.  Lymphadenopathy:    She has no cervical adenopathy.  Neurological: She is alert and oriented to person, place, and time. No cranial nerve deficit. She exhibits normal muscle tone.  Skin: Skin is warm and dry. No rash noted. No erythema.  Psychiatric: She has a normal mood and affect. Her behavior is normal. Judgment and thought content normal.     Assessment/Plan This is a 74 year old Caucasian female admitted for recurrent acute pancreatitis. 1. Pancreatitis: Lipase is elevated to 366. The patient will be nothing by mouth and placed on aggressive intravenous fluid. Goal is pain management. At this time it is unclear if she has a gallstone as the patient is on oral bile acids. Reviewed GI consultations to plan further workup. She has no signs or symptoms of sepsis 2. Cholelithiasis: Continue ursodiol 3. Elevated alkaline phosphatase: Check vitamin D level 4. Diverticulosis: Stable 5. DVT prophylaxis: Heparin 6. GI prophylaxis: Pantoprazole The patient is a full code. Time spent on admission orders and patient care approximately 35 minutes  Harrie Foreman 02/09/2015, 6:51 AM

## 2015-02-09 NOTE — ED Provider Notes (Signed)
[facility] Emergency Department Provider Note  ____________________________________________  Time seen: 26  I have reviewed the triage vital signs and the nursing notes.   HISTORY  Chief Complaint Abdominal Pain  history of pancreatitis    HPI Felicia Daniels is a 74 y.o. female with a long history of pancreatitis. She reports she has had 6-7 episodes of pain flares due to pancreatitis. She does not drink alcohol. She does have calcifications in her pancreas as diagnosed by recent CT.  The patient was in the hospital for the same less than a week ago. She had a CT scan then. During her hospitalization, her lipase level dropped to a normal 55. She was discharged proximally 4 days ago. Earlier today, she went to see her gastroenterologist, Dr. Vira Agar. She had some mild tenderness then but nothing out of the ordinary according to the patient. This evening, the pain has become significantly worse.  She denies any weakness, shortness of breath, or diarrhea.    Past Medical History  Diagnosis Date  . Pancreatitis   . Diverticulosis   . Cholelithiasis   . Fibrocystic disease of breast     Patient Active Problem List   Diagnosis Date Noted  . Acute pancreatitis 01/28/2015    History reviewed. No pertinent past surgical history.  Current Outpatient Rx  Name  Route  Sig  Dispense  Refill  . Cholecalciferol (VITAMIN D3) 1000 UNITS CAPS   Oral   Take 1 tablet by mouth daily.         . Multiple Vitamin (MULTI-VITAMINS) TABS   Oral   Take 1 tablet by mouth daily.         Marland Kitchen omeprazole (PRILOSEC) 20 MG capsule   Oral   Take 1 capsule by mouth daily.         . ursodiol (ACTIGALL) 300 MG capsule   Oral   Take 1 capsule by mouth 2 (two) times daily.           Allergies Keflex and Penicillins  Family History  Problem Relation Age of Onset  . Skin cancer Mother   . Breast cancer Daughter     Social History Social History  Substance  Use Topics  . Smoking status: Never Smoker   . Smokeless tobacco: None  . Alcohol Use: No    Review of Systems  Constitutional: Negative for fever. ENT: Negative for sore throat. Cardiovascular: Negative for chest pain. Respiratory: Negative for shortness of breath. Gastrointestinal: Positive for upper abdominal pain and history of pancreatitis. Genitourinary: Negative for dysuria. Musculoskeletal: No myalgias or injuries. Skin: Negative for rash. Neurological: Negative for headaches   10-point ROS otherwise negative.  ____________________________________________   PHYSICAL EXAM:  VITAL SIGNS: ED Triage Vitals  Enc Vitals Group     BP 02/08/15 2330 158/61 mmHg     Pulse Rate 02/08/15 2330 67     Resp 02/08/15 2330 20     Temp 02/08/15 2330 98.4 F (36.9 C)     Temp Source 02/08/15 2330 Oral     SpO2 02/08/15 2330 97 %     Weight 02/08/15 2330 160 lb (72.576 kg)     Height 02/08/15 2330 5\' 3"  (1.6 m)     Head Cir --      Peak Flow --      Pain Score 02/08/15 2332 3     Pain Loc --      Pain Edu? --      Excl. in [place]? --  Constitutional:  Alert and oriented. Patient appears uncomfortable but otherwise in no acute distress. ENT   Head: Normocephalic and atraumatic.   Nose: No congestion/rhinnorhea.   Mouth/Throat: Mucous membranes are moist. Cardiovascular: Normal rate at 70, regular rhythm, no murmur noted Respiratory:  Normal respiratory effort, no tachypnea.    Breath sounds are clear and equal bilaterally.  Gastrointestinal: Soft, with notable tenderness in the epigastric area.. No distention.  Back: No muscle spasm, no tenderness, no CVA tenderness. Musculoskeletal: No deformity noted. Nontender with normal range of motion in all extremities.  No noted edema. Neurologic:  Normal speech and language. No gross focal neurologic deficits are appreciated.  Skin:  Skin is warm, dry. No rash noted. Psychiatric: Mood and affect are normal. Speech and  behavior are normal.  ____________________________________________    LABS (pertinent positives/negatives)  Labs Reviewed  COMPREHENSIVE METABOLIC PANEL - Abnormal; Notable for the following:    Glucose, Bld 111 (*)    Alkaline Phosphatase 230 (*)    All other components within normal limits  LIPASE, BLOOD - Abnormal; Notable for the following:    Lipase 366 (*)    All other components within normal limits  URINALYSIS COMPLETEWITH MICROSCOPIC ([facility] ONLY) - Abnormal; Notable for the following:    Color, Urine YELLOW (*)    APPearance CLEAR (*)    Bacteria, UA MANY (*)    Squamous Epithelial / LPF 0-5 (*)    All other components within normal limits  CBC WITH DIFFERENTIAL/PLATELET   ____________________________________________   INITIAL IMPRESSION / ASSESSMENT AND PLAN / ED COURSE  Pertinent labs & imaging results that were available during my care of the patient were reviewed by me and considered in my medical decision making (see chart for details).   Pleasant 74 year old female, appears her than stated age, with discomfort consistent with her prior pancreatitis.  Her lipase level is elevated to 366. This would be the equivalent of just over 3000 on our old assay and is a significant elevation.  We are treating her with IV fluids and pain medication. With the initial dose of half a milligram of Dilaudid, the patient did have some nausea. She is receiving Zofran as well.  We will keep the patient nothing by mouth and seek admission to the hospital for ongoing pain control and bowel rest as she recovers from this repeat flare of pancreatitis.  At this time, I do not feel any additional imaging is indicated.  ____________________________________________   FINAL CLINICAL IMPRESSION(S) / ED DIAGNOSES  Final diagnoses:  Idiopathic acute pancreatitis  Epigastric pain      Ahmed Prima, MD 02/09/15 [phone]

## 2015-02-09 NOTE — Progress Notes (Signed)
[facility] [place] NAME: Felicia Daniels    MR#:  [id number]  DATE OF BIRTH:  07/24/1940  SUBJECTIVE:  CHIEF COMPLAINT:   Chief Complaint  Patient presents with  . Abdominal Pain   still having some abdominal pain. Positive nausea but no vomiting.  REVIEW OF SYSTEMS:    Review of Systems  Constitutional: Negative for fever and chills.  HENT: Negative for congestion and tinnitus.   Eyes: Negative for blurred vision and double vision.  Respiratory: Negative for cough, shortness of breath and wheezing.   Cardiovascular: Negative for chest pain, orthopnea and PND.  Gastrointestinal: Positive for nausea and abdominal pain ( epigastric). Negative for vomiting and diarrhea.  Genitourinary: Negative for dysuria and hematuria.  Neurological: Negative for dizziness, sensory change and focal weakness.  All other systems reviewed and are negative.   Nutrition: NPO Tolerating Diet: No Tolerating PT: Ambulatory.    DRUG ALLERGIES:   Allergies  Allergen Reactions  . Synthroid [Levothyroxine Sodium] Shortness Of Breath and Other (See Comments)    Reaction: like she was having a heart attack. Sweats, nausea, felt like she couldn't breath.  . Keflex [Cephalexin] Rash  . Penicillins Rash    VITALS:  Blood pressure 122/42, pulse 64, temperature 97.9 F (36.6 C), temperature source Oral, resp. rate 16, height 5\' 3"  (1.6 m), weight 72.439 kg (159 lb 11.2 oz), SpO2 99 %.  PHYSICAL EXAMINATION:   Physical Exam  GENERAL:  74 y.o.-year-old patient lying in the bed with no acute distress.  EYES: Pupils equal, round, reactive to light and accommodation. No scleral icterus. Extraocular muscles intact.  HEENT: Head atraumatic, normocephalic. Oropharynx and nasopharynx clear.  NECK:  Supple, no jugular venous distention. No thyroid enlargement, no tenderness.  LUNGS: Normal breath sounds bilaterally, no wheezing, rales, rhonchi. No use of  accessory muscles of respiration.  CARDIOVASCULAR: S1, S2 normal. 2/6 diastolic murmur at the left sternal border, no rubs, or gallops.  ABDOMEN: Soft, tender in the epigastric area, no rebound or rigidity, nondistended. Bowel sounds present. No organomegaly or mass.  EXTREMITIES: No cyanosis, clubbing or edema b/l.    NEUROLOGIC: Cranial nerves II through XII are intact. No focal Motor or sensory deficits b/l.   PSYCHIATRIC: The patient is alert and oriented x 3. Good affect SKIN: No obvious rash, lesion, or ulcer.    LABORATORY PANEL:   CBC  Recent Labs Lab 02/08/15 2334  WBC 6.8  HGB 12.3  HCT 36.3  PLT 337   ------------------------------------------------------------------------------------------------------------------  Chemistries   Recent Labs Lab 02/08/15 2334  NA 141  K 3.9  CL 107  CO2 28  GLUCOSE 111*  BUN 18  CREATININE 0.75  CALCIUM 9.6  AST 36  ALT 29  ALKPHOS 230*  BILITOT 0.6   ------------------------------------------------------------------------------------------------------------------  Cardiac Enzymes No results for input(s): TROPONINI in the last 168 hours. ------------------------------------------------------------------------------------------------------------------  RADIOLOGY:  No results found.   ASSESSMENT AND PLAN:   74 year old female with past medical history of chronic pancreatitis, diverticulosis, history of cholelithiasis presented to the hospital abdominal pain nausea vomiting and noted to have a mildly elevated lipase.  #1 acute on chronic pancreatitis-likely the cause of patient's abdominal pain nausea vomiting. -Continue supportive care with IV fluids, antiemetics, pain control. -Clinically feels a bit better and therefore was started on clear liquid diet today. Lipase elevation is likely insignificant  #2 cholelithiasis-continue ursodiol  #3 GERD-continue Protonix  #4 osteoporosis-continue vitamin D  supplements.  All the records  are reviewed and case discussed with Care Management/Social Workerr. Management plans discussed with the patient, family and they are in agreement.  CODE STATUS: Full  DVT Prophylaxis: Heparin subcutaneous  TOTAL TIME TAKING CARE OF THIS PATIENT: 25 minutes.   POSSIBLE D/C IN 1-2 DAYS, DEPENDING ON CLINICAL CONDITION.   Henreitta Leber M.D on 02/09/2015 at 10:52 AM  Between 7am to 6pm - Pager - [phone]  After 6pm go to [email or web address] - password [email or web address] [facility]  Office  [phone]  CC: Primary care physician; Kirk Ruths., MD

## 2015-02-09 NOTE — ED Notes (Signed)
Pt reports abd pain, hx of pancreatitis.  Pt reports abd pain x 10 days, and hospitalized one day last week.  Pt n/v/d.  Pt NAD at this time.

## 2015-02-10 LAB — LIPASE, BLOOD: LIPASE: 87 U/L — AB (ref 22–51)

## 2015-02-10 NOTE — Discharge Instructions (Signed)
*  Notify your doctor if your symptoms return and/or become worse.  *Take all medications as prescribed.  *Notify your doctor with any questions or concerns.

## 2015-02-10 NOTE — Progress Notes (Signed)
Patient alert and oriented. VSS. C/O tenderness to epigastric area but declined intervention. Discharge orders received pending that patient tolerated her diet which was advanced.  Patient received lunch tray and stated that she tolerated to solid foods and felt as though she could go home.  Discharge instructions were reviewed with the patient who verbalized understanding. A copy of the instructions were also provided to the patient. The patient was discharged home with family. No acute distress noted at the time of discharge.

## 2015-02-10 NOTE — Discharge Summary (Signed)
[facility] NAME: Felicia Daniels    MR#:  [id number]  DATE OF BIRTH:  15-Apr-1941  DATE OF ADMISSION:  02/09/2015 ADMITTING PHYSICIAN: Harrie Foreman, MD  DATE OF DISCHARGE: 02/10/2015  1:28 PM  PRIMARY CARE PHYSICIAN: Kirk Ruths., MD    ADMISSION DIAGNOSIS:  Idiopathic acute pancreatitis [K85.0] Epigastric pain [R10.13]  DISCHARGE DIAGNOSIS:  Active Problems:   Pancreatitis   SECONDARY DIAGNOSIS:   Past Medical History  Diagnosis Date  . Pancreatitis   . Diverticulosis   . Cholelithiasis   . Fibrocystic disease of breast     HOSPITAL COURSE:   74 year old female with past medical history of chronic pancreatitis, diverticulosis, history of cholelithiasis presented to the hospital abdominal pain nausea vomiting and noted to have a mildly elevated lipase.  #1 acute on chronic pancreatitis-likely the cause of patient's abdominal pain nausea vomiting. -Patient was treated with supportive care with IV fluids, pain control, antiemetics. With supportive care patient's clinical symptoms have improved and her diet has been slowly advanced from liquid to regular diet which she is tolerating without any evidence of worsening abdominal pain nausea vomiting. -Her lipase is almost normalized and therefore she is being discharged home. -The exact etiology of her relapsing pancreatitis is unclear but patient will continue follow-up with gastroenterology as an outpatient.  #2 cholelithiasis-patient will continue ursodiol  #3 GERD-patient will continue Protonix  #4 osteoporosis-patient will continue vitamin D supplements  DISCHARGE CONDITIONS:   Stable  CONSULTS OBTAINED:     DRUG ALLERGIES:   Allergies  Allergen Reactions  . Synthroid [Levothyroxine Sodium] Shortness Of Breath and Other (See Comments)    Reaction: like she was having a heart attack. Sweats, nausea, felt like she couldn't breath.  . Keflex  [Cephalexin] Rash  . Penicillins Rash    DISCHARGE MEDICATIONS:   Discharge Medication List as of 02/10/2015 11:51 AM    CONTINUE these medications which have NOT CHANGED   Details  Cholecalciferol (VITAMIN D3) 1000 UNITS CAPS Take 1 tablet by mouth daily., Until Discontinued, Historical Med    Multiple Vitamin (MULTI-VITAMINS) TABS Take 1 tablet by mouth daily., Until Discontinued, Historical Med    omeprazole (PRILOSEC) 20 MG capsule Take 1 capsule by mouth daily., Starting 01/07/2015, Until Discontinued, Historical Med    ursodiol (ACTIGALL) 300 MG capsule Take 1 capsule by mouth 2 (two) times daily., Starting 10/10/2014, Until Discontinued, Historical Med         DISCHARGE INSTRUCTIONS:   DIET:  Low fat, Low cholesterol diet  DISCHARGE CONDITION:  Stable  ACTIVITY:  Activity as tolerated  OXYGEN:  Home Oxygen: No.   Oxygen Delivery: room air  DISCHARGE LOCATION:  home   If you experience worsening of your admission symptoms, develop shortness of breath, life threatening emergency, suicidal or homicidal thoughts you must seek medical attention immediately by calling 911 or calling your MD immediately  if symptoms less severe.  You Must read complete instructions/literature along with all the possible adverse reactions/side effects for all the Medicines you take and that have been prescribed to you. Take any new Medicines after you have completely understood and accpet all the possible adverse reactions/side effects.   Please note  You were cared for by a hospitalist during your hospital stay. If you have any questions about your discharge medications or the care you received while you were in the hospital after you are discharged, you can call the unit and asked to speak with  the hospitalist on call if the hospitalist that took care of you is not available. Once you are discharged, your primary care physician will handle any further medical issues. Please note that NO  REFILLS for any discharge medications will be authorized once you are discharged, as it is imperative that you return to your primary care physician (or establish a relationship with a primary care physician if you do not have one) for your aftercare needs so that they can reassess your need for medications and monitor your lab values.     Today   Tolerating clear liquid diet well. no nausea, vomiting or abdominal pain  VITAL SIGNS:  Blood pressure 123/63, pulse 61, temperature 98.7 F (37.1 C), temperature source Oral, resp. rate 16, height 5\' 3"  (1.6 m), weight 73.483 kg (162 lb), SpO2 96 %.  I/O:   Intake/Output Summary (Last 24 hours) at 02/10/15 1347 Last data filed at 02/10/15 0909  Gross per 24 hour  Intake 3323.5 ml  Output   2200 ml  Net 1123.5 ml    PHYSICAL EXAMINATION:  GENERAL:  73 y.o.-year-old patient lying in the bed with no acute distress.  EYES: Pupils equal, round, reactive to light and accommodation. No scleral icterus. Extraocular muscles intact.  HEENT: Head atraumatic, normocephalic. Oropharynx and nasopharynx clear.  NECK:  Supple, no jugular venous distention. No thyroid enlargement, no tenderness.  LUNGS: Normal breath sounds bilaterally, no wheezing, rales,rhonchi. No use of accessory muscles of respiration.  CARDIOVASCULAR: S1, S2 normal. No murmurs, rubs, or gallops.  ABDOMEN: Soft, non-tender, non-distended. Bowel sounds present. No organomegaly or mass.  EXTREMITIES: No pedal edema, cyanosis, or clubbing.  NEUROLOGIC: Cranial nerves II through XII are intact. No focal motor or sensory defecits b/l.  PSYCHIATRIC: The patient is alert and oriented x 3. Good affect.  SKIN: No obvious rash, lesion, or ulcer.   DATA REVIEW:   CBC  Recent Labs Lab 02/08/15 2334  WBC 6.8  HGB 12.3  HCT 36.3  PLT 337    Chemistries   Recent Labs Lab 02/08/15 2334  NA 141  K 3.9  CL 107  CO2 28  GLUCOSE 111*  BUN 18  CREATININE 0.75  CALCIUM 9.6  AST  36  ALT 29  ALKPHOS 230*  BILITOT 0.6    Cardiac Enzymes No results for input(s): TROPONINI in the last 168 hours.  Microbiology Results  No results found for this or any previous visit.  RADIOLOGY:  No results found.    Management plans discussed with the patient, family and they are in agreement.  CODE STATUS:     Code Status Orders        Start     Ordered   02/09/15 0425  Full code   Continuous     02/09/15 0424    Advance Directive Documentation        Most Recent Value   Type of Advance Directive  Living will   Pre-existing out of facility DNR order (yellow form or pink MOST form)     "MOST" Form in Place?        TOTAL TIME TAKING CARE OF THIS PATIENT: 40 minutes.    Henreitta Leber M.D on 02/10/2015 at 1:47 PM  Between 7am to 6pm - Pager - [phone]  After 6pm go to [email or web address] - password [email or web address] [facility]  Office  [phone]  CC: Primary care physician; Kirk Ruths., MD

## 2015-02-17 LAB — VITAMIN D 1,25 DIHYDROXY
Vitamin D 1, 25 (OH)2 Total: 41 pg/mL
Vitamin D2 1, 25 (OH)2: <10 pg/mL
Vitamin D3 1, 25 (OH)2: 41 pg/mL

## 2015-02-27 ENCOUNTER — Encounter: Admission: RE | Payer: Self-pay | Source: Ambulatory Visit

## 2015-02-27 ENCOUNTER — Ambulatory Visit
Admission: RE | Admit: 2015-02-27 | Payer: Medicare Other | Source: Ambulatory Visit | Admitting: Unknown Physician Specialty

## 2015-02-27 SURGERY — COLONOSCOPY WITH PROPOFOL
Anesthesia: General

## 2015-03-07 ENCOUNTER — Other Ambulatory Visit: Payer: Self-pay | Admitting: Internal Medicine

## 2015-03-07 DIAGNOSIS — Z1231 Encounter for screening mammogram for malignant neoplasm of breast: Secondary | ICD-10-CM

## 2015-04-08 ENCOUNTER — Other Ambulatory Visit: Payer: Self-pay | Admitting: Internal Medicine

## 2015-04-08 ENCOUNTER — Ambulatory Visit
Admission: RE | Admit: 2015-04-08 | Discharge: 2015-04-08 | Disposition: A | Discharge status: Home / Self Care | Payer: Medicare Other | Source: Ambulatory Visit | Attending: Internal Medicine | Admitting: Internal Medicine

## 2015-04-08 DIAGNOSIS — Z1231 Encounter for screening mammogram for malignant neoplasm of breast: Secondary | ICD-10-CM | POA: Insufficient documentation

## 2015-04-09 ENCOUNTER — Other Ambulatory Visit: Payer: Self-pay | Admitting: Internal Medicine

## 2015-04-09 ENCOUNTER — Ambulatory Visit: Payer: Medicare Other

## 2015-04-09 DIAGNOSIS — N63 Unspecified lump in unspecified breast: Secondary | ICD-10-CM

## 2015-04-12 ENCOUNTER — Ambulatory Visit
Admission: RE | Admit: 2015-04-12 | Discharge: 2015-04-12 | Disposition: A | Discharge status: Home / Self Care | Payer: Medicare Other | Source: Ambulatory Visit | Attending: Internal Medicine | Admitting: Internal Medicine

## 2015-04-12 ENCOUNTER — Other Ambulatory Visit: Payer: Self-pay | Admitting: Internal Medicine

## 2015-04-12 DIAGNOSIS — N63 Unspecified lump in unspecified breast: Secondary | ICD-10-CM

## 2015-05-01 HISTORY — PX: ERCP: SHX60

## 2015-09-12 NOTE — Anesthesia Preprocedure Evaluation (Addendum)
Anesthesia Evaluation  Patient identified by MRN, date of birth, ID bandGeneral Assessment Comment:Pt had a sphincterotomy at [facility] 06/2015- after the procedure, she was persistently hypotensive requiring admission to the SICU on pressors.  They were weaned over a day and she went to the regular floor.  Echo at the time showed a normal EF with mild AS (peak gradient 25).    Reviewed: Allergy & Precautions, H&P , NPO status , Patient's Chart, lab work & pertinent test results  Airway Mallampati: II  TM Distance: >3 FB Neck ROM: full    Dental no notable dental hx.    Pulmonary    Pulmonary exam normal        Cardiovascular  Rhythm:regular Rate:Normal     Neuro/Psych    GI/Hepatic GERD  ,  Endo/Other    Renal/GU      Musculoskeletal   Abdominal   Peds  Hematology   Anesthesia Other Findings   Reproductive/Obstetrics                            Anesthesia Physical Anesthesia Plan  ASA: II  Anesthesia Plan: MAC and Regional   Post-op Pain Management:    Induction:   Airway Management Planned:   Additional Equipment:   Intra-op Plan:   Post-operative Plan:   Informed Consent: I have reviewed the patients History and Physical, chart, labs and discussed the procedure including the risks, benefits and alternatives for the proposed anesthesia with the patient or authorized representative who has indicated his/her understanding and acceptance.     Plan Discussed with: CRNA  Anesthesia Plan Comments:         Anesthesia Quick Evaluation

## 2015-09-20 ENCOUNTER — Encounter
Admission: RE | Disposition: A | Discharge status: Home / Self Care | Payer: Self-pay | Source: Ambulatory Visit | Attending: Unknown Physician Specialty

## 2015-09-20 ENCOUNTER — Ambulatory Visit: Payer: Medicare Other | Admitting: Anesthesiology

## 2015-09-20 ENCOUNTER — Ambulatory Visit
Admission: RE | Admit: 2015-09-20 | Discharge: 2015-09-20 | Disposition: A | Discharge status: Home / Self Care | Payer: Medicare Other | Source: Ambulatory Visit | Attending: Unknown Physician Specialty | Admitting: Unknown Physician Specialty

## 2015-09-20 DIAGNOSIS — Z888 Allergy status to other drugs, medicaments and biological substances status: Secondary | ICD-10-CM | POA: Diagnosis not present

## 2015-09-20 DIAGNOSIS — Z79899 Other long term (current) drug therapy: Secondary | ICD-10-CM | POA: Insufficient documentation

## 2015-09-20 DIAGNOSIS — Z880 Allergy status to penicillin: Secondary | ICD-10-CM | POA: Diagnosis not present

## 2015-09-20 DIAGNOSIS — M1811 Unilateral primary osteoarthritis of first carpometacarpal joint, right hand: Secondary | ICD-10-CM | POA: Insufficient documentation

## 2015-09-20 DIAGNOSIS — E079 Disorder of thyroid, unspecified: Secondary | ICD-10-CM | POA: Diagnosis not present

## 2015-09-20 DIAGNOSIS — Z881 Allergy status to other antibiotic agents status: Secondary | ICD-10-CM | POA: Diagnosis not present

## 2015-09-20 DIAGNOSIS — K219 Gastro-esophageal reflux disease without esophagitis: Secondary | ICD-10-CM | POA: Insufficient documentation

## 2015-09-20 HISTORY — DX: Adverse effect of unspecified anesthetic, initial encounter: T41.45XA

## 2015-09-20 HISTORY — DX: Gastro-esophageal reflux disease without esophagitis: K21.9

## 2015-09-20 HISTORY — PX: FINGER ARTHROPLASTY: SHX5017

## 2015-09-20 HISTORY — DX: Other complications of anesthesia, initial encounter: T88.59XA

## 2015-09-20 HISTORY — DX: Cardiac murmur, unspecified: R01.1

## 2015-09-20 SURGERY — ARTHROPLASTY, FINGER
Anesthesia: Monitor Anesthesia Care | Site: Hand | Laterality: Right | Wound class: Clean

## 2015-09-20 MED ORDER — NORCO 5-325 MG PO TABS: 1.0000 | ORAL_TABLET | Freq: Four times a day (QID) | ORAL | Status: DC | PRN

## 2015-09-20 MED ORDER — ROPIVACAINE HCL 5 MG/ML IJ SOLN
INTRAMUSCULAR | Status: DC | PRN
  Administered 2015-09-20: 40 mL via PERINEURAL

## 2015-09-20 MED ORDER — CLINDAMYCIN PHOSPHATE 600 MG/50ML IV SOLN
INTRAVENOUS | Status: DC | PRN
  Administered 2015-09-20: 600 mg via INTRAVENOUS

## 2015-09-20 MED ORDER — PROPOFOL 500 MG/50ML IV EMUL
INTRAVENOUS | Status: DC | PRN
  Administered 2015-09-20: 50 ug/kg/min via INTRAVENOUS

## 2015-09-20 MED ORDER — LACTATED RINGERS IV SOLN
INTRAVENOUS | Status: DC
  Administered 2015-09-20: 08:00:00 via INTRAVENOUS

## 2015-09-20 MED ORDER — DEXAMETHASONE SODIUM PHOSPHATE 4 MG/ML IJ SOLN
INTRAMUSCULAR | Status: DC | PRN
Start: 2015-09-20 — End: 2015-09-20
  Administered 2015-09-20: 4 mg via PERINEURAL

## 2015-09-20 MED ORDER — ONDANSETRON HCL 4 MG/2ML IJ SOLN
INTRAMUSCULAR | Status: DC | PRN
  Administered 2015-09-20: 4 mg via INTRAVENOUS

## 2015-09-20 MED ORDER — LIDOCAINE HCL (CARDIAC) 20 MG/ML IV SOLN
INTRAVENOUS | Status: DC | PRN
  Administered 2015-09-20: 40 mg via INTRATRACHEAL

## 2015-09-20 MED ORDER — MIDAZOLAM HCL 5 MG/5ML IJ SOLN
INTRAMUSCULAR | Status: DC | PRN
  Administered 2015-09-20: 1 mg via INTRAVENOUS

## 2015-09-20 SURGICAL SUPPLY — 41 items
BANDAGE ELASTIC 2 CLIP NS LF (GAUZE/BANDAGES/DRESSINGS) ×6 IMPLANT
BLADE MED AGGRESSIVE (BLADE) IMPLANT
BLADE OSC/SAGITTAL MD 5.5X18 (BLADE) IMPLANT
BLADE OSC/SAGITTAL MD 9X18.5 (BLADE) ×3 IMPLANT
BNDG ESMARK 4X12 TAN STRL LF (GAUZE/BANDAGES/DRESSINGS) ×3 IMPLANT
BUR RND CARBIDE 3.0 (BURR) IMPLANT
BURR 3.0 ROUND DIAMOND (BURR) IMPLANT
BURR 3.0MM ROUND DIAMOND (BURR)
CAST PADDING 2X4YD NS (MISCELLANEOUS) ×3 IMPLANT
DURAPREP 26ML APPLICATOR (WOUND CARE) ×3 IMPLANT
GAUZE SPONGE 4X4 12PLY STRL (GAUZE/BANDAGES/DRESSINGS) ×3 IMPLANT
GLOVE BIO SURGEON STRL SZ7.5 (GLOVE) ×6 IMPLANT
GLOVE BIO SURGEON STRL SZ8 (GLOVE) ×3 IMPLANT
GLOVE BIOGEL PI IND STRL 8 (GLOVE) ×2 IMPLANT
GLOVE BIOGEL PI INDICATOR 8 (GLOVE) ×4
GOWN STRL REUS W/ TWL LRG LVL3 (GOWN DISPOSABLE) ×2 IMPLANT
GOWN STRL REUS W/TWL LRG LVL3 (GOWN DISPOSABLE) ×4
KIT BIO-TENODESIS 3X8 DISP (MISCELLANEOUS) ×2
KIT INSRT BABSR STRL DISP BTN (MISCELLANEOUS) ×1 IMPLANT
KIT ROOM TURNOVER OR (KITS) ×3 IMPLANT
LOOP VESSEL RED MINI 1.3X0.9 (MISCELLANEOUS) IMPLANT
LOOPS RED MINI 1.3MMX0.9MM (MISCELLANEOUS)
NDL MAYO CATGUT SZ5 (NEEDLE) ×2
NDL SUT 5 .5 CRC TPR PNT MAYO (NEEDLE) ×1 IMPLANT
NS IRRIG 500ML POUR BTL (IV SOLUTION) ×3 IMPLANT
PACK EXTREMITY ARMC (MISCELLANEOUS) ×3 IMPLANT
PAD GROUND ADULT SPLIT (MISCELLANEOUS) ×3 IMPLANT
SCREW BIOCOM TENODESIS 4X10M (Screw) ×3 IMPLANT
SLING ARM M TX990204 (SOFTGOODS) ×3 IMPLANT
SOL PREP PVP 2OZ (MISCELLANEOUS) ×3
SOLUTION PREP PVP 2OZ (MISCELLANEOUS) ×1 IMPLANT
SPLINT CAST 1 STEP 3X12 (MISCELLANEOUS) ×3 IMPLANT
STOCKINETTE 4X48 STRL (DRAPES) ×3 IMPLANT
SUT ETHILON 3-0 FS-10 30 BLK (SUTURE) ×3
SUT ETHILON 4-0 (SUTURE) ×2
SUT ETHILON 4-0 FS2 18XMFL BLK (SUTURE) ×1
SUT VIC AB 2-0 CT2 27 (SUTURE) ×3 IMPLANT
SUT VIC AB 3-0 SH 27 (SUTURE) ×2
SUT VIC AB 3-0 SH 27X BRD (SUTURE) ×1 IMPLANT
SUTURE EHLN 3-0 FS-10 30 BLK (SUTURE) ×1 IMPLANT
SUTURE ETHLN 4-0 FS2 18XMF BLK (SUTURE) ×1 IMPLANT

## 2015-09-20 NOTE — Progress Notes (Signed)
Assisted Mike Stella ANMD with right, interscalene  block. Side rails up, monitors on throughout procedure. See vital signs in flow sheet. Tolerated Procedure well. ° ° °

## 2015-09-20 NOTE — Discharge Instructions (Signed)
General Anesthesia, Adult, Care After °Refer to this sheet in the next few weeks. These instructions provide you with information on caring for yourself after your procedure. Your health care provider may also give you more specific instructions. Your treatment has been planned according to current medical practices, but problems sometimes occur. Call your health care provider if you have any problems or questions after your procedure. °WHAT TO EXPECT AFTER THE PROCEDURE °After the procedure, it is typical to experience: °· Sleepiness. °· Nausea and vomiting. °HOME CARE INSTRUCTIONS °· For the first 24 hours after general anesthesia: °¨ Have a responsible person with you. °¨ Do not drive a car. If you are alone, do not take public transportation. °¨ Do not drink alcohol. °¨ Do not take medicine that has not been prescribed by your health care provider. °¨ Do not sign important papers or make important decisions. °¨ You may resume a normal diet and activities as directed by your health care provider. °· Change bandages (dressings) as directed. °· If you have questions or problems that seem related to general anesthesia, call the hospital and ask for the anesthetist or anesthesiologist on call. °SEEK MEDICAL CARE IF: °· You have nausea and vomiting that continue the day after anesthesia. °· You develop a rash. °SEEK IMMEDIATE MEDICAL CARE IF:  °· You have difficulty breathing. °· You have chest pain. °· You have any allergic problems. °  °This information is not intended to replace advice given to you by your health care provider. Make sure you discuss any questions you have with your health care provider. °  °Document Released: 08/24/2000 Document Revised: 06/08/2014 Document Reviewed: 09/16/2011 °Elsevier Interactive Patient Education ©2016 [employer] ° °Ice pack ° °Elevation ° °RTC in about 10 days ° °Keep dressing dry °

## 2015-09-20 NOTE — Anesthesia Postprocedure Evaluation (Signed)
Anesthesia Post Note  Patient: Felicia Daniels  Procedure(s) Performed: Procedure(s) (LRB): FINGER ARTHROPLASTY RIGHT THUMB CMC (Right)  Patient location during evaluation: PACU Anesthesia Type: Regional Level of consciousness: awake and alert and oriented Pain management: satisfactory to patient Vital Signs Assessment: post-procedure vital signs reviewed and stable Respiratory status: spontaneous breathing, nonlabored ventilation and respiratory function stable Cardiovascular status: blood pressure returned to baseline and stable Postop Assessment: Adequate PO intake and No signs of nausea or vomiting Anesthetic complications: no    Raliegh Ip

## 2015-09-20 NOTE — Op Note (Signed)
PATIENT:Felicia Daniels, Felicia Daniels.  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  The patient was taken to the operating room after a supraclavicular block was achieved in the preop area. The patient was placed in the supine position.  The patient was given IV antibiotic at the start of procedure. A tourniquet was applied to the right upper arm and the right upper extremity was prepped and draped in usual fashion for procedure about the hand. The right upper extremity was exsanguinated and the tourniquet was inflated  I made a short slightly curved incision over the dorsal aspect of the [facility] joint of the right thumb. Dissection was carried down bluntly to the joint capsule. Care was taken to protect the tendons and dorsal nerves. The capsule was divided in line with the incision. I reflected the capsule off of the trapezium and then removed the trapezium in its entirety using an oscillating saw and rongeur.  I then made about a centimeter and a half incision over the flexor carpi radialis tendon about 10 cm proximal to the wrist joint. I dissected down to the flexor carpi radialis tendon and divided the tendon. The tendon was then pulled into the trapezial space and then pulled from within the joint through an obliquely made hole in the base of the first metacarpal. The tendon was secured to the drill hole with a 4 mm Arthrex radiolucent interference screw. The remaining portion of the tendon was placed in the trapezial joint space. The tourniquet was released at this time.  It was up about 50 minutes. Bleeding was controlled with  coagulation cautery. The capsule was closed with 2-0 Vicryl and the subcutaneous tissue with 3-0 Vicryl and the skin with 3 and 4-0 nylon sutures. The small volar forearm incision was  closed with 3-0 nylon sutures.   A sterile dressing was applied followed by a fiberglass thumb spica splint. The patient was then awakened and transferred to a stretcher bed. The patient was taken to the recovery room in satisfactory condition. Blood loss was negligible.

## 2015-09-20 NOTE — H&P (Signed)
  H and P reviewed. No changes. Uploaded at later date. 

## 2015-09-20 NOTE — Transfer of Care (Signed)
Immediate Anesthesia Transfer of Care Note  Patient: Felicia Daniels  Procedure(s) Performed: Procedure(s): FINGER ARTHROPLASTY RIGHT THUMB CMC (Right)  Patient Location: PACU  Anesthesia Type: MAC, Regional  Level of Consciousness: awake, alert  and patient cooperative  Airway and Oxygen Therapy: Patient Spontanous Breathing and Patient connected to supplemental oxygen  Post-op Assessment: Post-op Vital signs reviewed, Patient's Cardiovascular Status Stable, Respiratory Function Stable, Patent Airway and No signs of Nausea or vomiting  Post-op Vital Signs: Reviewed and stable  Complications: No apparent anesthesia complications

## 2015-09-20 NOTE — Anesthesia Procedure Notes (Addendum)
Anesthesia Regional Block:  Supraclavicular block  Pre-Anesthetic Checklist: ,, timeout performed, Correct Patient, Correct Site, Correct Laterality, Correct Procedure, Correct Position, site marked, Risks and benefits discussed,  Surgical consent,  Pre-op evaluation,  At surgeon's request and post-op pain management  Laterality: Right  Prep: chloraprep       Needles:  Injection technique: Single-shot  Needle Type: Echogenic Stimulator Needle      Needle Gauge: 21 and 21 G    Additional Needles:  Procedures: ultrasound guided (picture in chart) and nerve stimulator Supraclavicular block  Nerve Stimulator or Paresthesia:  Response: bicep contraction, 0.45 mA,   Additional Responses:   Narrative:  Start time: 09/20/2015 8:10 AM End time: 09/20/2015 8:17 AM Injection made incrementally with aspirations every 5 mL.  Performed by: Personally  Anesthesiologist: Ronelle Nigh  Additional Notes: Functioning IV was confirmed and monitors applied.  Sterile prep and drape,hand hygiene and sterile gloves were used.Ultrasound guidance: relevant anatomy identified, needle position confirmed, local anesthetic spread visualized around nerve(s)., vascular puncture avoided.  Image printed for medical record.  Negative aspiration and negative test dose prior to incremental administration of local anesthetic. The patient tolerated the procedure well. Vitals signes recorded in RN notes.   Procedure Name: MAC Performed by: Mayme Genta Pre-anesthesia Checklist: Patient identified, Emergency Drugs available, Suction available, Patient being monitored and Timeout performed Patient Re-evaluated:Patient Re-evaluated prior to inductionOxygen Delivery Method: Simple face mask Placement Confirmation: positive ETCO2 and breath sounds checked- equal and bilateral

## 2015-09-24 ENCOUNTER — Encounter: Payer: Self-pay | Admitting: Unknown Physician Specialty

## 2015-12-04 ENCOUNTER — Ambulatory Visit: Payer: Medicare Other | Attending: Unknown Physician Specialty | Admitting: Occupational Therapy

## 2015-12-04 DIAGNOSIS — M79641 Pain in right hand: Secondary | ICD-10-CM | POA: Diagnosis present

## 2015-12-04 DIAGNOSIS — M6281 Muscle weakness (generalized): Secondary | ICD-10-CM | POA: Diagnosis present

## 2015-12-04 DIAGNOSIS — M25641 Stiffness of right hand, not elsewhere classified: Secondary | ICD-10-CM | POA: Insufficient documentation

## 2015-12-04 DIAGNOSIS — M25531 Pain in right wrist: Secondary | ICD-10-CM | POA: Insufficient documentation

## 2015-12-04 DIAGNOSIS — M25631 Stiffness of right wrist, not elsewhere classified: Secondary | ICD-10-CM | POA: Insufficient documentation

## 2015-12-04 NOTE — Therapy (Signed)
[facility] [address] [place], [place], [address] Phone: [phone]   Fax:  [phone]  Occupational Therapy Treatment  Patient Details  Name: Felicia Daniels MRN: [id number] Date of Birth: 1941-03-21 Referring Provider: Erin Sonsharold Kernodle  Encounter Date: 12/04/2015      OT End of Session - 12/04/15 1617    Visit Number 1   Number of Visits 12   Date for OT Re-Evaluation 01/15/16   OT Start Time 1230   OT Stop Time 1329   OT Time Calculation (min) 59 min   Activity Tolerance Patient tolerated treatment well   Behavior [facility] for tasks assessed/performed      Past Medical History  Diagnosis Date  . Pancreatitis   . Diverticulosis   . Cholelithiasis   . Fibrocystic disease of breast   . Complication of anesthesia     hard to wake up [facility], had to stay extra days in ICU.  Marland Kitchen. Heart murmur   . GERD (gastroesophageal reflux disease)     Past Surgical History  Procedure Laterality Date  . Meniscus repair Right   . Abdominal hysterectomy    . Breast cyst aspiration Left   . Pancreas surgery      blockage surgery  . Finger arthroplasty Right 09/20/2015    Procedure: FINGER ARTHROPLASTY RIGHT THUMB CMC;  Surgeon: Erin SonsHarold Kernodle, MD;  [facility];  Service: Orthopedics;  Laterality: Right;    There were no vitals filed for this visit.      Subjective Assessment - 12/04/15 1240    Subjective  My thumb was bothering me for about 3 yrs,  had surgery - hard time writing , opening jars, wrist feels week, pain in the wrist - anything with pressure on thumb , turn key   Patient Stated Goals Get the strenght, pain better - complete mobility    Currently in Pain? Yes   Pain Score 2    Pain Location Hand   Pain Orientation Right   Pain Descriptors / Indicators Aching;Tightness   Pain Onset More than a month ago            [facility]  Assessment - 12/04/15 0001    Assessment   Diagnosis CMC arthroplasty   Referring Provider Erin Sonsharold Kernodle   Onset Date 09/20/15   Balance Screen   Has the patient fallen in the past 6 months No   Home  Environment   Lives With Alone   Prior Function   Vocation Full time employment   Leisure R hand dominant , writing , computer, - play grandkids, garden, cross word puzzle, table ,read    AROM   Right Wrist Extension 47 Degrees   Right Wrist Flexion 47 Degrees   Right Wrist Radial Deviation 6 Degrees   Right Wrist Ulnar Deviation 28 Degrees   Left Wrist Extension 70 Degrees   Left Wrist Flexion 95 Degrees   Left Wrist Radial Deviation 23 Degrees   Left Wrist Ulnar Deviation 36 Degrees   Strength   Right Hand Grip (lbs) 19   Right Hand Lateral Pinch 6 lbs   Right Hand 3 Point Pinch 5 lbs   Left Hand Grip (lbs) 42   Left Hand Lateral Pinch 13 lbs   Left Hand 3 Point Pinch 13 lbs   Right Hand AROM   R Thumb MCP 0-60 30 Degrees   R Thumb IP 0-80 70  Degrees   R Thumb Radial ABduction/ADduction 0-55 38   R Thumb Palmar ABduction/ADduction 0-45 35   R Thumb Opposition to Index --  Opposition to 2nd fold of 5th    Left Hand AROM   L Thumb MCP 0-60 65 Degrees   L Thumb IP 0-80 72 Degrees   L Thumb Radial ADduction/ABduction 0-55 43   L Thumb Palmar ADduction/ABduction 0-45 44   L Thumb Opposition to Index --  WNL           Fluidotherapy done 10 min with AROM of wrist and thumb in all planes to increase ROM and decrease pain   HEP reviewed and hand out provided  See pt instruction                 OT Education - 12/04/15 1617    Education provided Yes   Education Details See pt isntruction    Person(s) Educated Patient   Methods Explanation;Demonstration;Tactile cues;Verbal cues;Handout   Comprehension Verbal cues required;Returned demonstration;Verbalized understanding          OT Short Term Goals - 12/04/15 1623    OT SHORT TERM GOAL #1   Title  Pain on PRWHE decrease by at least 10 points    Baseline pain on PRWHE at eval18/50   Time 3   Period Weeks   Status New   OT SHORT TERM GOAL #2   Title AROM of R thumb increase in flexion at MP by 10 degrees and CMC PA and RA by at least 5 degrees to pick up 1/2 cup    Baseline PA and RA decrease , MP flexion decreased compare to L    Time 3   Period Weeks   Status New   OT SHORT TERM GOAL #3   Title pt to be ind in wearing of splnts to decreaes pain and increase functional use R dominant hand    Baseline no splint since last week when thumb spica discharged   Time 2   Period Weeks   Status New           OT Long Term Goals - 12/04/15 1626    OT LONG TERM GOAL #1   Title Function on PRHWE improve with  at least 10 points    Baseline 17/50 at eval for PRWHE function score   Time 5   Period Weeks   Status New   OT LONG TERM GOAL #2   Title R Grip strength improve to more than 50% compare the L to hold cup , grip ADL's objects with no pain    Baseline see flowsheet   Time 6   Period Weeks   Status New   OT LONG TERM GOAL #3   Title R Wrist AROM improve to [facility] with no pain to use in lifting , carring and pulling objects    Baseline R wrist AROM decrease in all planes and  increase pain    Time 4   Period Weeks   Status New   OT LONG TERM GOAL #4   Title 3 poitn and lat grip improve by 2-3 lbs with or without  splint to turn key , write with less pain    Baseline see flowsheet   Time 6   Period Weeks   Status New               Plan - 12/04/15 1618    Clinical Impression Statement Pt present about 10 1/2 wks postop R CMC  arthroplasty - pt was in soft cast for about 3 wks and then prefab thumb spica until last week - pt present with decrease ROM at R thumb , increase pain , decrease strength in prehension and strength - pt  show increase  MP hyper extention when loading  thumb and decrease  CMC extention -  all lmitting her use of R hand in ADLs and IADL's     Rehab Potential Good   OT Frequency 2x / week   OT Duration 6 weeks   OT Treatment/Interventions Therapeutic exercise;Patient/family education;Self-care/ADL training;Manual Therapy;Splinting;DME and/or AE instruction;Parrafin;Fluidtherapy;Passive range of motion   Plan assess how did with HEP and use of CMC splnt - fabricate finger base splint to decrease hyper extnetion of MP    OT Home Exercise Plan see pt instruction    Consulted and Agree with Plan of Care Patient      Patient will benefit from skilled therapeutic intervention in order to improve the following deficits and impairments:  Impaired flexibility, Decreased range of motion, Decreased coordination, Decreased knowledge of precautions, Decreased knowledge of use of DME, Impaired UE functional use, Pain, Decreased strength  Visit Diagnosis: Pain in right hand - Plan: Ot plan of care cert/re-cert  Stiffness of right hand, not elsewhere classified - Plan: Ot plan of care cert/re-cert  Muscle weakness (generalized) - Plan: Ot plan of care cert/re-cert  Stiffness of right wrist, not elsewhere classified - Plan: Ot plan of care cert/re-cert  Pain in right wrist - Plan: Ot plan of care cert/re-cert    Problem List Patient Active Problem List   Diagnosis Date Noted  . Pancreatitis 02/09/2015  . Acute pancreatitis 01/28/2015    Rosalyn Gess  OTR/L,CLT   12/04/2015, 4:35 PM  [place] [facility] [address], [place], [address] Phone: [phone]   Fax:  [phone]  Name: Felicia Daniels MRN: [id number] Date of Birth: [birth year]

## 2015-12-04 NOTE — Patient Instructions (Signed)
Heat  PROM for wrist ext, flexion  AROM for wrist in all planes  Keep pain under 2/10   PROM for R thumb MP flexion  blockec AROM R thumb MP  Thumb PA and RA AROM - but stop when hyper extention of MP  Circular AROM for thumb  Opposition AROM   Fitted with CMC neoprene splint to wear during activities and writing  Ed on wearing and precautions

## 2015-12-11 ENCOUNTER — Ambulatory Visit: Payer: Medicare Other | Admitting: Occupational Therapy

## 2015-12-11 DIAGNOSIS — M79641 Pain in right hand: Secondary | ICD-10-CM

## 2015-12-11 DIAGNOSIS — M25631 Stiffness of right wrist, not elsewhere classified: Secondary | ICD-10-CM

## 2015-12-11 DIAGNOSIS — M6281 Muscle weakness (generalized): Secondary | ICD-10-CM

## 2015-12-11 DIAGNOSIS — M25641 Stiffness of right hand, not elsewhere classified: Secondary | ICD-10-CM

## 2015-12-11 DIAGNOSIS — M25531 Pain in right wrist: Secondary | ICD-10-CM

## 2015-12-11 NOTE — Therapy (Signed)
[place] [facility] [address], [place], [address] Phone: [phone]   Fax:  [phone]  Occupational Therapy Treatment  Patient Details  Name: Felicia Daniels MRN: [id number] Date of Birth: 1940/11/17 Referring Provider: Leanor Kail  Encounter Date: 12/11/2015      OT End of Session - 12/11/15 1207    Visit Number 2   Number of Visits 12   Date for OT Re-Evaluation 01/15/16   OT Start Time 1200   OT Stop Time 1233   OT Time Calculation (min) 33 min   Activity Tolerance Patient tolerated treatment well   Behavior During Therapy [facility] for tasks assessed/performed      Past Medical History  Diagnosis Date  . Pancreatitis   . Diverticulosis   . Cholelithiasis   . Fibrocystic disease of breast   . 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  Marland Kitchen Heart murmur   . GERD (gastroesophageal reflux disease)     Past Surgical History  Procedure Laterality Date  . Meniscus repair Right   . Abdominal hysterectomy    . Breast cyst aspiration Left   . Pancreas surgery      blockage surgery  . Finger arthroplasty Right 09/20/2015    Procedure: FINGER ARTHROPLASTY RIGHT THUMB [place];  Surgeon: Leanor Kail, MD;  Location: [place];  Service: Orthopedics;  Laterality: Right;    There were no vitals filed for this visit.      Subjective Assessment - 12/11/15 1204    Subjective  I can tell difference - wrist not as stiff and sore , as well as thumb moving better - still little writing , ,opening jars    Patient Stated Goals Get the strenght, pain better - complete mobility    Currently in Pain? Yes   Pain Score 2    Pain Location Finger (Comment which one)   Pain Orientation Right   Pain Descriptors / Indicators Aching   Pain Onset More than a month ago            [facility] OT Assessment - 12/11/15 0001    Right  Hand AROM   R Thumb Radial ABduction/ADduction 0-55 45   R Thumb Palmar ABduction/ADduction 0-45 47                  OT Treatments/Exercises (OP) - 12/11/15 0001    RUE Fluidotherapy   Number Minutes Fluidotherapy 10 Minutes   RUE Fluidotherapy Location Hand;Wrist   Comments AROM at [facility] to increase ROM and decrease pain att wrist and thumb                 OT Education - 12/11/15 1207    Education provided Yes   Education Details update HEP    Person(s) Educated Patient   Methods Explanation;Demonstration;Tactile cues;Verbal cues   Comprehension Verbal cues required;Returned demonstration;Verbalized understanding          OT Short Term Goals - 12/04/15 1623    OT SHORT TERM GOAL #1   Title Pain on PRWHE decrease by at least 10 points    Baseline pain on PRWHE at eval18/50   Time 3   Period Weeks   Status New   OT SHORT TERM GOAL #2   Title AROM of R thumb increase in flexion at MP by 10 degrees and CMC PA and RA  by at least 5 degrees to pick up 1/2 cup    Baseline PA and RA decrease , MP flexion decreased compare to L    Time 3   Period Weeks   Status New   OT SHORT TERM GOAL #3   Title pt to be ind in wearing of splnts to decreaes pain and increase functional use R dominant hand    Baseline no splint since last week when thumb spica discharged   Time 2   Period Weeks   Status New           OT Long Term Goals - 12/04/15 1626    OT LONG TERM GOAL #1   Title Function on PRHWE improve with  at least 10 points    Baseline 17/50 at eval for PRWHE function score   Time 5   Period Weeks   Status New   OT LONG TERM GOAL #2   Title R Grip strength improve to more than 50% compare the L to hold cup , grip ADL's objects with no pain    Baseline see flowsheet   Time 6   Period Weeks   Status New   OT LONG TERM GOAL #3   Title R Wrist AROM improve to [facility] with no pain to use in lifting , carring and pulling objects    Baseline R wrist AROM decrease  in all planes and  increase pain    Time 4   Period Weeks   Status New   OT LONG TERM GOAL #4   Title 3 poitn and lat grip improve by 2-3 lbs with or without  splint to turn key , write with less pain    Baseline see flowsheet   Time 6   Period Weeks   Status New               Plan - 12/11/15 1208    Clinical Impression Statement Pt show increase AROM at wrist and thumb - pt report increase ROM , decrease pain and stiffness in thumb and wrist - cont with same HEP - had to review some of them to do correctly and with better results    Rehab Potential Good   OT Frequency 2x / week   OT Duration 4 weeks   OT Treatment/Interventions Therapeutic exercise;Patient/family education;Self-care/ADL training;Manual Therapy;Splinting;DME and/or AE instruction;Parrafin;Fluidtherapy;Passive range of motion   OT Home Exercise Plan see pt instruction    Consulted and Agree with Plan of Care Patient      Patient will benefit from skilled therapeutic intervention in order to improve the following deficits and impairments:  Impaired flexibility, Decreased range of motion, Decreased coordination, Decreased knowledge of precautions, Decreased knowledge of use of DME, Impaired UE functional use, Pain, Decreased strength  Visit Diagnosis: Pain in right hand  Stiffness of right hand, not elsewhere classified  Muscle weakness (generalized)  Stiffness of right wrist, not elsewhere classified  Pain in right wrist    Problem List Patient Active Problem List   Diagnosis Date Noted  . Pancreatitis 02/09/2015  . Acute pancreatitis 01/28/2015    Rosalyn Gess OTR/L, CLT 12/11/2015, 12:41 PM  [place] [facility] [address] [place], [address] Phone: [phone]   Fax:  [phone]  Name: Felicia Daniels MRN: [id number] Date of Birth: Apr 21, 1941

## 2015-12-11 NOTE — Patient Instructions (Addendum)
Same HEP - do wrist extention palms together  Flexion stretch over arm rest   and AROM over armrest  Slight pull

## 2015-12-13 ENCOUNTER — Ambulatory Visit: Payer: Medicare Other | Admitting: Occupational Therapy

## 2015-12-13 DIAGNOSIS — M25641 Stiffness of right hand, not elsewhere classified: Secondary | ICD-10-CM

## 2015-12-13 DIAGNOSIS — M25631 Stiffness of right wrist, not elsewhere classified: Secondary | ICD-10-CM

## 2015-12-13 DIAGNOSIS — M25531 Pain in right wrist: Secondary | ICD-10-CM

## 2015-12-13 DIAGNOSIS — M79641 Pain in right hand: Secondary | ICD-10-CM | POA: Diagnosis not present

## 2015-12-13 DIAGNOSIS — M6281 Muscle weakness (generalized): Secondary | ICD-10-CM

## 2015-12-13 NOTE — Therapy (Signed)
[place] [facility] [address] [place], [address] Phone: [phone]   Fax:  [phone]  Occupational Therapy Treatment  Patient Details  Name: Felicia Daniels MRN: [id number] Date of Birth: 08/07/1940 Referring Provider: Leanor Kail  Encounter Date: 12/13/2015      OT End of Session - 12/13/15 1500    Visit Number 3   Number of Visits 12   Date for OT Re-Evaluation 01/15/16   OT Start Time 1201   OT Stop Time 1244   OT Time Calculation (min) 43 min   Activity Tolerance Patient tolerated treatment well   Behavior During Therapy [facility] for tasks assessed/performed      Past Medical History  Diagnosis Date  . Pancreatitis   . Diverticulosis   . Cholelithiasis   . Fibrocystic disease of breast   . Complication of anesthesia     hard to wake up from pancreas blockage surgery at [facility] In January, had to stay extra days in ICU.  Marland Kitchen Heart murmur   . GERD (gastroesophageal reflux disease)     Past Surgical History  Procedure Laterality Date  . Meniscus repair Right   . Abdominal hysterectomy    . Breast cyst aspiration Left   . Pancreas surgery      blockage surgery  . Finger arthroplasty Right 09/20/2015    Procedure: FINGER ARTHROPLASTY RIGHT THUMB [place];  Surgeon: Leanor Kail, MD;  Location: [place];  Service: Orthopedics;  Laterality: Right;    There were no vitals filed for this visit.      Subjective Assessment - 12/13/15 1454    Subjective  Doing better - I am doing my exercises - thumb and wrist getting there    Patient Stated Goals Get the strenght, pain better - complete mobility    Currently in Pain? Yes   Pain Score 1    Pain Location Finger (Comment which one)   Pain Orientation Right   Pain Descriptors / Indicators Aching   Pain Type Chronic pain;Surgical pain   Pain Onset More than a month ago            [facility] OT Assessment - 12/13/15 0001    AROM   Right  Wrist Extension 63 Degrees  end of session   Right Wrist Flexion 58 Degrees  end   Right Wrist Radial Deviation 15 Degrees                  OT Treatments/Exercises (OP) - 12/13/15 0001    RUE Fluidotherapy   Number Minutes Fluidotherapy 10 Minutes   RUE Fluidotherapy Location Hand;Wrist   Comments AROM for wrist in all planes and thumb in all planes to increase ROM and decrease pain - at [facility]      After fluido  SOft tissue mobs to thumb webspace and thumb - together with PA and RA stretch    PROM for wrist ext, flexion  AROM for wrist in all planes  Keep pain under 2/10   PROM for R thumb MP flexion  blockec AROM R thumb MP  Thumb PA and RA AAROM - but stop when hyper extention of MP  Circular AROM for thumb  Opposition AROM  Isometric strengthening for PA and RA in neutral - had pain at end range  Light blue putty for grip , lat grip and 3 point grip  Pain about 1/10 - pt to keep it that way  10 -12 reps only -  2 x day              OT Education - 12/13/15 1500    Education provided Yes   Education Details update EHP    Person(s) Educated Patient   Methods Explanation;Demonstration;Tactile cues;Verbal cues;Handout   Comprehension Verbal cues required;Returned demonstration;Verbalized understanding          OT Short Term Goals - 12/04/15 1623    OT SHORT TERM GOAL #1   Title Pain on PRWHE decrease by at least 10 points    Baseline pain on PRWHE at eval18/50   Time 3   Period Weeks   Status New   OT SHORT TERM GOAL #2   Title AROM of R thumb increase in flexion at MP by 10 degrees and CMC PA and RA by at least 5 degrees to pick up 1/2 cup    Baseline PA and RA decrease , MP flexion decreased compare to L    Time 3   Period Weeks   Status New   OT SHORT TERM GOAL #3   Title pt to be ind in wearing of splnts to decreaes pain and increase functional use R dominant hand    Baseline no splint since last week when thumb spica discharged    Time 2   Period Weeks   Status New           OT Long Term Goals - 12/04/15 1626    OT LONG TERM GOAL #1   Title Function on PRHWE improve with  at least 10 points    Baseline 17/50 at eval for PRWHE function score   Time 5   Period Weeks   Status New   OT LONG TERM GOAL #2   Title R Grip strength improve to more than 50% compare the L to hold cup , grip ADL's objects with no pain    Baseline see flowsheet   Time 6   Period Weeks   Status New   OT LONG TERM GOAL #3   Title R Wrist AROM improve to [facility] with no pain to use in lifting , carring and pulling objects    Baseline R wrist AROM decrease in all planes and  increase pain    Time 4   Period Weeks   Status New   OT LONG TERM GOAL #4   Title 3 poitn and lat grip improve by 2-3 lbs with or without  splint to turn key , write with less pain    Baseline see flowsheet   Time 6   Period Weeks   Status New               Plan - 12/13/15 1500    Clinical Impression Statement Pt cont to report progress in ROM and use of R hand - still decrease strength and pain - iniitiated light putty for grip and prehnsion this date - and isometric for thumb PA and RA -    OT Frequency 2x / week   OT Duration 4 weeks   OT Treatment/Interventions Therapeutic exercise;Patient/family education;Self-care/ADL training;Manual Therapy;Splinting;DME and/or AE instruction;Parrafin;Fluidtherapy;Passive range of motion   Plan assess progress and update HEP    OT Home Exercise Plan see pt instruction    Consulted and Agree with Plan of Care Patient      Patient will benefit from skilled therapeutic intervention in order to improve the following deficits and impairments:  Impaired flexibility, Decreased range of motion, Decreased coordination, Decreased knowledge of precautions, Decreased knowledge  of use of DME, Impaired UE functional use, Pain, Decreased strength  Visit Diagnosis: Pain in right hand  Stiffness of right hand, not elsewhere  classified  Muscle weakness (generalized)  Stiffness of right wrist, not elsewhere classified  Pain in right wrist      G-Codes - Dec 31, 2015 1349    Functional Assessment Tool Used ROM , strength, PRWHE , clincial judgement   Functional Limitation Self care   Self Care Current Status [id number]) At least 40 percent but less than 60 percent impaired, limited or restricted   Self Care Goal Status [id number]) At least 1 percent but less than 20 percent impaired, limited or restricted      Problem List Patient Active Problem List   Diagnosis Date Noted  . Pancreatitis 02/09/2015  . Acute pancreatitis 01/28/2015    Rosalyn Gess OTR/L,CLT  2015-12-31, 3:02 PM  [place] [facility] [address], [place], [address] Phone: [phone]   Fax:  [phone]  Name: Felicia Daniels MRN: [id number] Date of Birth: Sep 26, 1940

## 2015-12-13 NOTE — Therapy (Signed)
[place] [facility] [address], [place], [address] Phone: [phone]   Fax:  [phone]  Occupational Therapy Treatment  Patient Details  Name: Felicia Daniels MRN: [id number] Date of Birth: 1940-08-10 Referring Provider: Leanor Kail  Encounter Date: 12/11/2015      OT End of Session - 12/13/15 1500    Visit Number 3   Number of Visits 12   Date for OT Re-Evaluation 01/15/16   OT Start Time 1201   OT Stop Time 1244   OT Time Calculation (min) 43 min   Activity Tolerance Patient tolerated treatment well   Behavior During Therapy [facility] for tasks assessed/performed      Past Medical History  Diagnosis Date  . Pancreatitis   . Diverticulosis   . Cholelithiasis   . Fibrocystic disease of breast   . Complication of anesthesia     hard to wake up from pancreas blockage surgery at [facility] In January, had to stay extra days in ICU.  Marland Kitchen Heart murmur   . GERD (gastroesophageal reflux disease)     Past Surgical History  Procedure Laterality Date  . Meniscus repair Right   . Abdominal hysterectomy    . Breast cyst aspiration Left   . Pancreas surgery      blockage surgery  . Finger arthroplasty Right 09/20/2015    Procedure: FINGER ARTHROPLASTY RIGHT THUMB [place];  Surgeon: Leanor Kail, MD;  Location: [place];  Service: Orthopedics;  Laterality: Right;    There were no vitals filed for this visit.      Subjective Assessment - 12/13/15 1454    Subjective  Doing better - I am doing my exercises - thumb and wrist getting there    Patient Stated Goals Get the strenght, pain better - complete mobility    Currently in Pain? Yes   Pain Score 1    Pain Location Finger (Comment which one)   Pain Orientation Right   Pain Descriptors / Indicators Aching   Pain Type Chronic pain;Surgical pain   Pain Onset More than a month ago            [facility] OT Assessment - 12/13/15 0001    AROM   Right  Wrist Extension 63 Degrees  end of session   Right Wrist Flexion 58 Degrees  end   Right Wrist Radial Deviation 15 Degrees                  OT Treatments/Exercises (OP) - 12/13/15 0001    RUE Fluidotherapy   Number Minutes Fluidotherapy 10 Minutes   RUE Fluidotherapy Location Hand;Wrist   Comments AROM for wrist in all planes and thumb in all planes to increase ROM and decrease pain - at [facility]      After fluido  SOft tissue mobs to thumb webspace and thumb - together with PA and RA stretch    PROM for wrist ext, flexion  Add AAROM over armrest of flexion  Prayer stretch for extention  RD and UD AAROM  And AROM   Keep pain under 2/10   PROM for R thumb MP flexion  blockec AROM R thumb MP  Thumb PA and RA AAROM - but stop when hyper extention of MP  Circular AROM for thumb   review again modifications for task and use of CMC neoprene splint               OT Education - 12/13/15 1500  Education provided Yes   Education Details update EHP    Person(s) Educated Patient   Methods Explanation;Demonstration;Tactile cues;Verbal cues;Handout   Comprehension Verbal cues required;Returned demonstration;Verbalized understanding          OT Short Term Goals - 12/04/15 1623    OT SHORT TERM GOAL #1   Title Pain on PRWHE decrease by at least 10 points    Baseline pain on PRWHE at eval18/50   Time 3   Period Weeks   Status New   OT SHORT TERM GOAL #2   Title AROM of R thumb increase in flexion at MP by 10 degrees and CMC PA and RA by at least 5 degrees to pick up 1/2 cup    Baseline PA and RA decrease , MP flexion decreased compare to L    Time 3   Period Weeks   Status New   OT SHORT TERM GOAL #3   Title pt to be ind in wearing of splnts to decreaes pain and increase functional use R dominant hand    Baseline no splint since last week when thumb spica discharged   Time 2   Period Weeks   Status New           OT Long Term Goals - 12/04/15  1626    OT LONG TERM GOAL #1   Title Function on PRHWE improve with  at least 10 points    Baseline 17/50 at eval for PRWHE function score   Time 5   Period Weeks   Status New   OT LONG TERM GOAL #2   Title R Grip strength improve to more than 50% compare the L to hold cup , grip ADL's objects with no pain    Baseline see flowsheet   Time 6   Period Weeks   Status New   OT LONG TERM GOAL #3   Title R Wrist AROM improve to [facility] with no pain to use in lifting , carring and pulling objects    Baseline R wrist AROM decrease in all planes and  increase pain    Time 4   Period Weeks   Status New   OT LONG TERM GOAL #4   Title 3 poitn and lat grip improve by 2-3 lbs with or without  splint to turn key , write with less pain    Baseline see flowsheet   Time 6   Period Weeks   Status New               Plan - 12/13/15 1500    Clinical Impression Statement Pt cont to report progress in ROM and use of R hand - still decrease strength and pain - iniitiated light putty for grip and prehnsion this date - and isometric for thumb PA and RA -    OT Frequency 2x / week   OT Duration 4 weeks   OT Treatment/Interventions Therapeutic exercise;Patient/family education;Self-care/ADL training;Manual Therapy;Splinting;DME and/or AE instruction;Parrafin;Fluidtherapy;Passive range of motion   Plan assess progress and update HEP    OT Home Exercise Plan see pt instruction    Consulted and Agree with Plan of Care Patient      Patient will benefit from skilled therapeutic intervention in order to improve the following deficits and impairments:  Impaired flexibility, Decreased range of motion, Decreased coordination, Decreased knowledge of precautions, Decreased knowledge of use of DME, Impaired UE functional use, Pain, Decreased strength  Visit Diagnosis: Pain in right hand  Stiffness of right hand, not  elsewhere classified  Muscle weakness (generalized)  Stiffness of right wrist, not  elsewhere classified  Pain in right wrist      G-Codes - 12/26/15 1349    Functional Assessment Tool Used ROM , strength, PRWHE , clincial judgement   Functional Limitation Self care   Self Care Current Status [id number]) At least 40 percent but less than 60 percent impaired, limited or restricted   Self Care Goal Status [id number]) At least 1 percent but less than 20 percent impaired, limited or restricted      Problem List Patient Active Problem List   Diagnosis Date Noted  . Pancreatitis 02/09/2015  . Acute pancreatitis 01/28/2015    Rosalyn Gess OTR/L,CLT  December 26, 2015, 3:03 PM  [place] [facility] [address] [place], [address] Phone: [phone]   Fax:  [phone]  Name: Felicia Daniels MRN: [id number] Date of Birth: 12-Mar-1941

## 2015-12-13 NOTE — Patient Instructions (Signed)
Same HEP but need to do PROM for wrist flexion and extention  and AAROM over armrest for flexion and extention end range   Add Isometric strengthening for PA and RA in neutral - had pain at end range  Light blue putty for grip , lat grip and 3 point grip  Pain about 1/10 - pt to keep it that way  10 -12 reps only - 2 x day

## 2015-12-17 ENCOUNTER — Ambulatory Visit: Payer: Medicare Other | Admitting: Occupational Therapy

## 2015-12-17 DIAGNOSIS — M25631 Stiffness of right wrist, not elsewhere classified: Secondary | ICD-10-CM

## 2015-12-17 DIAGNOSIS — M79641 Pain in right hand: Secondary | ICD-10-CM | POA: Diagnosis not present

## 2015-12-17 DIAGNOSIS — M6281 Muscle weakness (generalized): Secondary | ICD-10-CM

## 2015-12-17 DIAGNOSIS — M25531 Pain in right wrist: Secondary | ICD-10-CM

## 2015-12-17 DIAGNOSIS — M25641 Stiffness of right hand, not elsewhere classified: Secondary | ICD-10-CM

## 2015-12-17 NOTE — Therapy (Signed)
[facility] [address], [place], [address] Phone: [phone]   Fax:  [phone]  Occupational Therapy Treatment  Patient Details  Name: Felicia Daniels MRN: [id number] Date of Birth: 07-07-1940 Referring Provider: Leanor Kail  Encounter Date: 12/17/2015      OT End of Session - 12/17/15 1251    Visit Number 4   Number of Visits 12   Date for OT Re-Evaluation 01/15/16   OT Start Time 1216   OT Stop Time 1258   OT Time Calculation (min) 42 min   Activity Tolerance Patient tolerated treatment well   Behavior During Therapy [facility] for tasks assessed/performed      Past Medical History  Diagnosis Date  . Pancreatitis   . Diverticulosis   . Cholelithiasis   . Fibrocystic disease of breast   . Complication of anesthesia     hard to wake up from pancreas blockage surgery at [facility] In January, had to stay extra days in ICU.  Marland Kitchen Heart murmur   . GERD (gastroesophageal reflux disease)     Past Surgical History  Procedure Laterality Date  . Meniscus repair Right   . Abdominal hysterectomy    . Breast cyst aspiration Left   . Pancreas surgery      blockage surgery  . Finger arthroplasty Right 09/20/2015    Procedure: FINGER ARTHROPLASTY RIGHT THUMB [place];  Surgeon: Leanor Kail, MD;  Location: [place];  Service: Orthopedics;  Laterality: Right;    There were no vitals filed for this visit.      Subjective Assessment - 12/17/15 1236    Subjective  Doing better - not pain really with putty and exercises -    Currently in Pain? Yes   Pain Score 1    Pain Location Hand   Pain Orientation Right   Pain Descriptors / Indicators Tightness            [facility] OT Assessment - 12/17/15 0001    Strength   Right Hand Grip (lbs) 25   Right Hand Lateral Pinch 9 lbs   Right Hand 3 Point Pinch 8 lbs   Left Hand Grip (lbs) 42   Left Hand Lateral Pinch 13 lbs   Left Hand 3 Point Pinch 13 lbs    Right Hand AROM   R Thumb MCP 0-60 45 Degrees                  OT Treatments/Exercises (OP) - 12/17/15 0001    RUE Fluidotherapy   Number Minutes Fluidotherapy 10 Minutes   RUE Fluidotherapy Location Hand;Wrist   Comments AROM at [facility] to increase ROM and decrease pain       After fluido  SOft tissue mobs to thumb webspace and thumb - together with PA and RA stretch    PROM for wrist ext, flexion  AROM for wrist in all planes - RD and UD looks great - need to work on other 2 Keep pain under 2/10   PROM for R thumb MP flexion  blockec AROM R thumb MP  Thumb PA and RA AAROM - but stop when hyper extention of MP  Opposition AROM to base of 5th with more ease  Had increase MP flexion  Isometric strengthening for PA and RA at end range  Upgrade to teal putty for grip , lat grip and 3 point grip Do prehension around - 2 x   Pain about 1/10 - pt to keep  it that way  10 -12 reps only - 2 x day            OT Education - 12/17/15 1237    Education provided Yes   Education Details update HEP    Person(s) Educated Patient   Methods Explanation;Demonstration;Tactile cues   Comprehension Verbalized understanding;Returned demonstration          OT Short Term Goals - 12/04/15 1623    OT SHORT TERM GOAL #1   Title Pain on PRWHE decrease by at least 10 points    Baseline pain on PRWHE at eval18/50   Time 3   Period Weeks   Status New   OT SHORT TERM GOAL #2   Title AROM of R thumb increase in flexion at MP by 10 degrees and CMC PA and RA by at least 5 degrees to pick up 1/2 cup    Baseline PA and RA decrease , MP flexion decreased compare to L    Time 3   Period Weeks   Status New   OT SHORT TERM GOAL #3   Title pt to be ind in wearing of splnts to decreaes pain and increase functional use R dominant hand    Baseline no splint since last week when thumb spica discharged   Time 2   Period Weeks   Status New           OT Long Term Goals -  12/04/15 1626    OT LONG TERM GOAL #1   Title Function on PRHWE improve with  at least 10 points    Baseline 17/50 at eval for PRWHE function score   Time 5   Period Weeks   Status New   OT LONG TERM GOAL #2   Title R Grip strength improve to more than 50% compare the L to hold cup , grip ADL's objects with no pain    Baseline see flowsheet   Time 6   Period Weeks   Status New   OT LONG TERM GOAL #3   Title R Wrist AROM improve to [facility] with no pain to use in lifting , carring and pulling objects    Baseline R wrist AROM decrease in all planes and  increase pain    Time 4   Period Weeks   Status New   OT LONG TERM GOAL #4   Title 3 poitn and lat grip improve by 2-3 lbs with or without  splint to turn key , write with less pain    Baseline see flowsheet   Time 6   Period Weeks   Status New               Plan - 12/17/15 1252    Clinical Impression Statement Made great progress in MP flexion , grip and prehension strenght using putty - able to increase putty resistance this date - pt to keep pain 1-2/10 pain    Rehab Potential Good   OT Frequency 2x / week   OT Duration 4 weeks   OT Treatment/Interventions Therapeutic exercise;Patient/family education;Self-care/ADL training;Manual Therapy;Splinting;DME and/or AE instruction;Parrafin;Fluidtherapy;Passive range of motion   OT Home Exercise Plan see pt instruction    Consulted and Agree with Plan of Care Patient      Patient will benefit from skilled therapeutic intervention in order to improve the following deficits and impairments:  Impaired flexibility, Decreased range of motion, Decreased coordination, Decreased knowledge of precautions, Decreased knowledge of use of DME, Impaired UE functional use, Pain,  Decreased strength  Visit Diagnosis: Pain in right hand  Stiffness of right hand, not elsewhere classified  Muscle weakness (generalized)  Stiffness of right wrist, not elsewhere classified  Pain in right  wrist    Problem List Patient Active Problem List   Diagnosis Date Noted  . Pancreatitis 02/09/2015  . Acute pancreatitis 01/28/2015    Rosalyn Gess OTR/L,CLT  12/17/2015, 5:15 PM  [place] [facility] [address], [place], [address] Phone: [phone]   Fax:  [phone]  Name: Felicia Daniels MRN: [id number] Date of Birth: 14-Feb-1941

## 2015-12-17 NOTE — Patient Instructions (Addendum)
Upgrade to teal putty

## 2015-12-19 ENCOUNTER — Ambulatory Visit: Payer: Medicare Other | Admitting: Occupational Therapy

## 2015-12-19 DIAGNOSIS — M6281 Muscle weakness (generalized): Secondary | ICD-10-CM

## 2015-12-19 DIAGNOSIS — M79641 Pain in right hand: Secondary | ICD-10-CM

## 2015-12-19 DIAGNOSIS — M25631 Stiffness of right wrist, not elsewhere classified: Secondary | ICD-10-CM

## 2015-12-19 DIAGNOSIS — M25641 Stiffness of right hand, not elsewhere classified: Secondary | ICD-10-CM

## 2015-12-19 DIAGNOSIS — M25531 Pain in right wrist: Secondary | ICD-10-CM

## 2015-12-19 NOTE — Patient Instructions (Addendum)
Pt to add 1 lbs weight for wrist in all planes - can do 10-12 reps  X 2 if pain not increase  Change PROM for wrist flexion  And extention over edge of table

## 2015-12-19 NOTE — Therapy (Signed)
[facility] [address] [place], [address] Phone: [phone]   Fax:  [phone]  Occupational Therapy Treatment  Patient Details  Name: Felicia Daniels MRN: [id number] Date of Birth: [birth year] Referring Provider: Leanor Kail  Encounter Date: 12/19/2015      OT End of Session - 12/19/15 1401    Visit Number 5   Number of Visits 12   Date for OT Re-Evaluation 01/15/16   OT Start Time 1350   OT Stop Time 1432   OT Time Calculation (min) 42 min   Activity Tolerance Patient tolerated treatment well   Behavior During Therapy [facility] for tasks assessed/performed      Past Medical History  Diagnosis Date  . Pancreatitis   . Diverticulosis   . Cholelithiasis   . Fibrocystic disease of breast   . Complication of anesthesia     hard to wake up from pancreas blockage surgery at [facility] In January, had to stay extra days in ICU.  Marland Kitchen Heart murmur   . GERD (gastroesophageal reflux disease)     Past Surgical History  Procedure Laterality Date  . Meniscus repair Right   . Abdominal hysterectomy    . Breast cyst aspiration Left   . Pancreas surgery      blockage surgery  . Finger arthroplasty Right 09/20/2015    Procedure: FINGER ARTHROPLASTY RIGHT THUMB [place];  Surgeon: Leanor Kail, MD;  Location: [place];  Service: Orthopedics;  Laterality: Right;    There were no vitals filed for this visit.      Subjective Assessment - 12/19/15 1359    Subjective  Doing better - new putty is okay - not increase pain - stronger    Patient Stated Goals Get the strenght, pain better - complete mobility    Currently in Pain? No/denies                      OT Treatments/Exercises (OP) - 12/19/15 0001    RUE Fluidotherapy   Number Minutes Fluidotherapy 10 Minutes   RUE Fluidotherapy Location Hand;Wrist   Comments AROM for thumb and wrsit at [facility] to increase ROM and decrease pain       After fluido  SOft tissue mobs to thumb webspace and thumb - together with PA and RA stretch    PROM for wrist ext, flexion  - change to over edge of table  AROM for wrist in all planes - RD and UD AROM  Wrist 1 lbs in all planes - add to EHP   Keep pain under 1-2/10   PROM for R thumb MP flexion  blockec AROM R thumb MP  Thumb PA and RA AAROM - but stop when hyper extention of MP  Opposition AROM to base of 5th with more ease  Had increase MP flexion    teal putty for grip , lat grip and 3 point grip Do prehension around - 2 x  Pain about 1/10 - pt to keep it that way             OT Education - 12/19/15 1401    Education provided Yes   Education Details HEP   Person(s) Educated Patient   Methods Explanation;Tactile cues;Demonstration;Verbal cues   Comprehension Verbal cues required;Returned demonstration;Verbalized understanding          OT Short Term Goals - 12/04/15 1623    OT SHORT TERM GOAL #1   Title Pain on PRWHE  decrease by at least 10 points    Baseline pain on PRWHE at eval18/50   Time 3   Period Weeks   Status New   OT SHORT TERM GOAL #2   Title AROM of R thumb increase in flexion at MP by 10 degrees and CMC PA and RA by at least 5 degrees to pick up 1/2 cup    Baseline PA and RA decrease , MP flexion decreased compare to L    Time 3   Period Weeks   Status New   OT SHORT TERM GOAL #3   Title pt to be ind in wearing of splnts to decreaes pain and increase functional use R dominant hand    Baseline no splint since last week when thumb spica discharged   Time 2   Period Weeks   Status New           OT Long Term Goals - 12/04/15 1626    OT LONG TERM GOAL #1   Title Function on PRHWE improve with  at least 10 points    Baseline 17/50 at eval for PRWHE function score   Time 5   Period Weeks   Status New   OT LONG TERM GOAL #2   Title R Grip strength improve to more than 50% compare the L to hold cup , grip ADL's objects with  no pain    Baseline see flowsheet   Time 6   Period Weeks   Status New   OT LONG TERM GOAL #3   Title R Wrist AROM improve to [facility] with no pain to use in lifting , carring and pulling objects    Baseline R wrist AROM decrease in all planes and  increase pain    Time 4   Period Weeks   Status New   OT LONG TERM GOAL #4   Title 3 poitn and lat grip improve by 2-3 lbs with or without  splint to turn key , write with less pain    Baseline see flowsheet   Time 6   Period Weeks   Status New               Plan - 12/19/15 1401    Clinical Impression Statement Pt cont to make progress in ROM , strenght and able to tolerate increase strenghening - pain good - and increaes use of R domimant hand    Rehab Potential Good   OT Frequency 2x / week   OT Duration 4 weeks   OT Treatment/Interventions Therapeutic exercise;Patient/family education;Self-care/ADL training;Manual Therapy;Splinting;DME and/or AE instruction;Parrafin;Fluidtherapy;Passive range of motion   OT Home Exercise Plan see pt instruction    Consulted and Agree with Plan of Care Patient      Patient will benefit from skilled therapeutic intervention in order to improve the following deficits and impairments:  Impaired flexibility, Decreased range of motion, Decreased coordination, Decreased knowledge of precautions, Decreased knowledge of use of DME, Impaired UE functional use, Pain, Decreased strength  Visit Diagnosis: Pain in right hand  Stiffness of right hand, not elsewhere classified  Muscle weakness (generalized)  Stiffness of right wrist, not elsewhere classified  Pain in right wrist    Problem List Patient Active Problem List   Diagnosis Date Noted  . Pancreatitis 02/09/2015  . Acute pancreatitis 01/28/2015    Rosalyn Gess OTR/L,CLT  12/19/2015, 7:52 PM  [place] [facility] [address], [place], [address] Phone: [phone]  Fax:  [phone]  Name: Felicia Daniels MRN: [id number] Date of Birth: 07-Sep-1940

## 2015-12-23 ENCOUNTER — Ambulatory Visit: Payer: Medicare Other | Admitting: Occupational Therapy

## 2015-12-23 DIAGNOSIS — M25531 Pain in right wrist: Secondary | ICD-10-CM

## 2015-12-23 DIAGNOSIS — M25631 Stiffness of right wrist, not elsewhere classified: Secondary | ICD-10-CM

## 2015-12-23 DIAGNOSIS — M6281 Muscle weakness (generalized): Secondary | ICD-10-CM

## 2015-12-23 DIAGNOSIS — M79641 Pain in right hand: Secondary | ICD-10-CM

## 2015-12-23 DIAGNOSIS — M25641 Stiffness of right hand, not elsewhere classified: Secondary | ICD-10-CM

## 2015-12-23 NOTE — Patient Instructions (Signed)
Same HEP as last time - but needed some mod A and v/c to do correct the putty for grip and lat grip  And 1 lbs weight for RD and UD , wrist flexion and extention   and sup/pro

## 2015-12-23 NOTE — Therapy (Signed)
[facility] [address] [place], [address] Phone: [phone]   Fax:  [phone]  Occupational Therapy Treatment  Patient Details  Name: Felicia Daniels MRN: [id number] Date of Birth: 1941-01-01 Referring Provider: Leanor Kail  Encounter Date: 12/23/2015      OT End of Session - 12/23/15 1824    Visit Number 6   Number of Visits 12   Date for OT Re-Evaluation 01/15/16   OT Start Time 1119   OT Stop Time 1201   OT Time Calculation (min) 42 min   Activity Tolerance Patient tolerated treatment well   Behavior During Therapy [facility] for tasks assessed/performed      Past Medical History:  Diagnosis Date  . Cholelithiasis   . Complication of anesthesia    hard to wake up from pancreas blockage surgery at [facility] In January, had to stay extra days in ICU.  Marland Kitchen Diverticulosis   . Fibrocystic disease of breast   . GERD (gastroesophageal reflux disease)   . Heart murmur   . Pancreatitis     Past Surgical History:  Procedure Laterality Date  . ABDOMINAL HYSTERECTOMY    . BREAST CYST ASPIRATION Left   . FINGER ARTHROPLASTY Right 09/20/2015   Procedure: FINGER ARTHROPLASTY RIGHT THUMB [place];  Surgeon: Leanor Kail, MD;  Location: [place];  Service: Orthopedics;  Laterality: Right;  . MENISCUS REPAIR Right   . PANCREAS SURGERY     blockage surgery    There were no vitals filed for this visit.      Subjective Assessment - 12/23/15 1821    Subjective  I can tell getting stronger - thumb was stiff this am and then my wrist stays stiff - did do the putty and one lbs weight - not increase pain    Patient Stated Goals Get the strenght, pain better - complete mobility    Currently in Pain? No/denies                      OT Treatments/Exercises (OP) - 12/23/15 0001      RUE Fluidotherapy   Number Minutes Fluidotherapy 10 Minutes   RUE Fluidotherapy Location Hand;Wrist    Comments AROM for wrist and thumb AT to increaes ROM and decrease pain        After fluido  SOft tissue mobs to thumb webspace and thumb - together with PA and RA stretch    PROM for wrist ext, flexion  - change to over edge of table and with close fit  AROM for wrist in all planes - RD and UD AROM  And sup /pro   10 reps   needed mod A and cues to do correct Wrist 1 lbs in all planes - cont HEP   Keep pain under 1-2/10   PROM for R thumb MP flexion  blockec AROM R thumb MP  Thumb PA and RA AAROM - but stop when hyper extention of MP  Opposition AROM to base of 5th with more ease  Had increase MP flexion    teal putty for grip , lat grip and 3 point grip Do prehension around - 2 x Needed mon A again - to do correct lat and grip   Pain about 1/10 - pt to keep it that way             OT Education - 12/23/15 1823    Education provided Yes   Education Details review  HEP again    Person(s) Educated Patient   Methods Explanation;Demonstration;Tactile cues;Handout;Verbal cues   Comprehension Verbal cues required;Returned demonstration;Verbalized understanding          OT Short Term Goals - 12/04/15 1623      OT SHORT TERM GOAL #1   Title Pain on PRWHE decrease by at least 10 points    Baseline pain on PRWHE at eval18/50   Time 3   Period Weeks   Status New     OT SHORT TERM GOAL #2   Title AROM of R thumb increase in flexion at MP by 10 degrees and CMC PA and RA by at least 5 degrees to pick up 1/2 cup    Baseline PA and RA decrease , MP flexion decreased compare to L    Time 3   Period Weeks   Status New     OT SHORT TERM GOAL #3   Title pt to be ind in wearing of splnts to decreaes pain and increase functional use R dominant hand    Baseline no splint since last week when thumb spica discharged   Time 2   Period Weeks   Status New           OT Long Term Goals - 12/04/15 1626      OT LONG TERM GOAL #1   Title Function on PRHWE  improve with  at least 10 points    Baseline 17/50 at eval for PRWHE function score   Time 5   Period Weeks   Status New     OT LONG TERM GOAL #2   Title R Grip strength improve to more than 50% compare the L to hold cup , grip ADL's objects with no pain    Baseline see flowsheet   Time 6   Period Weeks   Status New     OT LONG TERM GOAL #3   Title R Wrist AROM improve to [facility] with no pain to use in lifting , carring and pulling objects    Baseline R wrist AROM decrease in all planes and  increase pain    Time 4   Period Weeks   Status New     OT LONG TERM GOAL #4   Title 3 poitn and lat grip improve by 2-3 lbs with or without  splint to turn key , write with less pain    Baseline see flowsheet   Time 6   Period Weeks   Status New               Plan - 12/23/15 1825    Clinical Impression Statement Pt cont to make progress in strenght - and use - pt still show  decrease wrist flexion - and thumb and grip was still decrease - reviewed again putty to do correctly to increase strength    Rehab Potential Good   OT Frequency 2x / week   OT Duration 4 weeks   OT Treatment/Interventions Therapeutic exercise;Patient/family education;Self-care/ADL training;Manual Therapy;Splinting;DME and/or AE instruction;Parrafin;Fluidtherapy;Passive range of motion   Plan assess progress and pain stll decrease   OT Home Exercise Plan see pt instruction    Consulted and Agree with Plan of Care Patient      Patient will benefit from skilled therapeutic intervention in order to improve the following deficits and impairments:  Impaired flexibility, Decreased range of motion, Decreased coordination, Decreased knowledge of precautions, Decreased knowledge of use of DME, Impaired UE functional use, Pain, Decreased strength  Visit  Diagnosis: Pain in right hand  Stiffness of right hand, not elsewhere classified  Muscle weakness (generalized)  Stiffness of right wrist, not elsewhere  classified  Pain in right wrist    Problem List Patient Active Problem List   Diagnosis Date Noted  . Pancreatitis 02/09/2015  . Acute pancreatitis 01/28/2015    Rosalyn Gess OTR/L,CLT  12/23/2015, 6:29 PM  [place] [facility] [address], [place], [address] Phone: [phone]   Fax:  [phone]  Name: Felicia Daniels MRN: [id number] Date of Birth: [birth year]

## 2015-12-25 ENCOUNTER — Ambulatory Visit: Payer: Medicare Other | Admitting: Occupational Therapy

## 2015-12-25 DIAGNOSIS — M25631 Stiffness of right wrist, not elsewhere classified: Secondary | ICD-10-CM

## 2015-12-25 DIAGNOSIS — M6281 Muscle weakness (generalized): Secondary | ICD-10-CM

## 2015-12-25 DIAGNOSIS — M25641 Stiffness of right hand, not elsewhere classified: Secondary | ICD-10-CM

## 2015-12-25 DIAGNOSIS — M79641 Pain in right hand: Secondary | ICD-10-CM

## 2015-12-25 DIAGNOSIS — M25531 Pain in right wrist: Secondary | ICD-10-CM

## 2015-12-25 NOTE — Therapy (Signed)
[facility] [address] [place], [address] Phone: [phone]   Fax:  [phone]  Occupational Therapy Treatment  Patient Details  Name: Felicia Daniels MRN: [id number] Date of Birth: [birth year] Referring Provider: Leanor Kail  Encounter Date: 12/25/2015      OT End of Session - 12/25/15 1657    Visit Number 7   Number of Visits 12   Date for OT Re-Evaluation 01/15/16   OT Start Time 1234   OT Stop Time 1313   OT Time Calculation (min) 39 min   Activity Tolerance Patient tolerated treatment well   Behavior During Therapy [facility] for tasks assessed/performed      Past Medical History:  Diagnosis Date  . Cholelithiasis   . 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  Marland Kitchen Diverticulosis   . Fibrocystic disease of breast   . GERD (gastroesophageal reflux disease)   . Heart murmur   . Pancreatitis     Past Surgical History:  Procedure Laterality Date  . ABDOMINAL HYSTERECTOMY    . BREAST CYST ASPIRATION Left   . FINGER ARTHROPLASTY Right 09/20/2015   Procedure: FINGER ARTHROPLASTY RIGHT THUMB [place];  Surgeon: Leanor Kail, MD;  Location: [place];  Service: Orthopedics;  Laterality: Right;  . MENISCUS REPAIR Right   . PANCREAS SURGERY     blockage surgery    There were no vitals filed for this visit.      Subjective Assessment - 12/25/15 1654    Subjective  I can tell my hand getting stronger - that wrist still stiff - but no pain really and no pain with putty - can open coke bottle, do most everything    Patient Stated Goals Get the strenght, pain better - complete mobility    Currently in Pain? No/denies                      OT Treatments/Exercises (OP) - 12/25/15 0001      RUE Fluidotherapy   Number Minutes Fluidotherapy 10 Minutes   RUE Fluidotherapy Location Hand;Wrist    Comments AROM for wrist flexion and extnetion - and thumb in all planes - prior to ROM - to increase ROM        After fluido  SOft tissue mobs to thumb webspace and thumb - together with PA and RA stretch   Graston tool nr 4 and 2 to palmar and dorsal wrist  To increase flexion and extention of wrist - done within stretch - sweeping, and scooping - brushing over volar wrist    with increase flexion and exention  - and less tightness per pt    PROM for wrist ext, flexion  AROM for wrist  Flexion and extention     10 reps   needed mod A and cues to do correct Wrist 1 lbs for wrist flexion and extention  And then 2 lbs for others  Hand out provided and reviewed - pt needed mod A  And min v/c  Keep pain under 1-2/10      Upgrade to green putty putty for grip , lat grip and 3 point grip Needed mon A again - to do correct lat Pain about 1/10 - pt to keep it that way            OT Education - 12/25/15 1656  Education provided Yes   Education Details HEP update and changes   Person(s) Educated Patient   Methods Explanation;Demonstration;Tactile cues;Verbal cues;Handout   Comprehension Verbal cues required;Returned demonstration;Verbalized understanding          OT Short Term Goals - 12/25/15 1700      OT SHORT TERM GOAL #1   Title Pain on PRWHE decrease by at least 10 points    Baseline pain on PRWHE at eval18/50 - pain improved - reviewed PRWHE next session    Time 2   Period Weeks   Status On-going     OT SHORT TERM GOAL #2   Title AROM of R thumb increase in flexion at MP by 10 degrees and CMC PA and RA by at least 5 degrees to pick up 1/2 cup    Status Achieved     OT SHORT TERM GOAL #3   Title pt to be ind in wearing of splnts to decreaes pain and increase functional use R dominant hand    Baseline no splint - has CMC neoprene if she needs it    Status Achieved           OT Long Term Goals - 12/25/15 1701      OT LONG TERM GOAL #1   Title  Function on PRHWE improve with  at least 10 points    Baseline 17/50 at eval for PRWHE function score - assess next session - using it more    Time 3   Period Weeks   Status On-going     OT LONG TERM GOAL #2   Title R Grip strength improve to more than 50% compare the L to hold cup , grip ADL's objects with no pain    Baseline see flowsheet   Time 3   Period Weeks   Status On-going     OT LONG TERM GOAL #3   Title R Wrist AROM improve to [facility] with no pain to use in lifting , carring and pulling objects    Baseline Still lmited in flexion and extnetion    Time 3   Period Weeks   Status On-going     OT LONG TERM GOAL #4   Title 3 poitn and lat grip improve by 2-3 lbs with or without  splint to turn key , write with less pain    Status Achieved               Plan - 12/25/15 1657    Clinical Impression Statement Pt report no pain with putty - did increase to green - needed some review for putty for lateral grip- pt cont to be limited in wrist flexoin and ext - pt to focus on PROM for flexion and extention of wrist - and upgrade to 2 lbs - but 1 lbs for flexion and extention -     Rehab Potential Good   OT Frequency 2x / week   OT Duration 4 weeks   OT Treatment/Interventions Therapeutic exercise;Patient/family education;Self-care/ADL training;Manual Therapy;Splinting;DME and/or AE instruction;Parrafin;Fluidtherapy;Passive range of motion   Plan assess progress - and can be discharge with HEP or one more visit 2 wks later   OT Home Exercise Plan see pt instruction    Consulted and Agree with Plan of Care Patient      Patient will benefit from skilled therapeutic intervention in order to improve the following deficits and impairments:  Impaired flexibility, Decreased range of motion, Decreased coordination, Decreased knowledge of precautions, Decreased knowledge of use  of DME, Impaired UE functional use, Pain, Decreased strength  Visit Diagnosis: Pain in right  hand  Stiffness of right hand, not elsewhere classified  Muscle weakness (generalized)  Stiffness of right wrist, not elsewhere classified  Pain in right wrist    Problem List Patient Active Problem List   Diagnosis Date Noted  . Pancreatitis 02/09/2015  . Acute pancreatitis 01/28/2015    Rosalyn Gess OTR/L,CLT  12/25/2015, 5:04 PM  [facility] [address] [place], [address] Phone: [phone]   Fax:  [phone]  Name: Felicia Daniels MRN: [id number] Date of Birth: March 15, 1941

## 2015-12-25 NOTE — Patient Instructions (Signed)
Same HEP but increase to 2 lbs for RD and UD   and SUP/pro   15 reps  And then upgrade to green putty for grip , lat and 3 point grip  12 reps  2 x day   if no increase pain can do 2 sets in 4 days

## 2015-12-30 ENCOUNTER — Ambulatory Visit
Admission: EM | Admit: 2015-12-30 | Discharge: 2015-12-30 | Disposition: A | Discharge status: Home / Self Care | Payer: Medicare Other | Attending: Family Medicine | Admitting: Family Medicine

## 2015-12-30 DIAGNOSIS — Z90710 Acquired absence of both cervix and uterus: Secondary | ICD-10-CM | POA: Insufficient documentation

## 2015-12-30 DIAGNOSIS — M545 Low back pain: Secondary | ICD-10-CM | POA: Insufficient documentation

## 2015-12-30 DIAGNOSIS — Z9889 Other specified postprocedural states: Secondary | ICD-10-CM | POA: Diagnosis not present

## 2015-12-30 DIAGNOSIS — M461 Sacroiliitis, not elsewhere classified: Secondary | ICD-10-CM | POA: Diagnosis not present

## 2015-12-30 DIAGNOSIS — Z880 Allergy status to penicillin: Secondary | ICD-10-CM | POA: Insufficient documentation

## 2015-12-30 DIAGNOSIS — Z79899 Other long term (current) drug therapy: Secondary | ICD-10-CM | POA: Insufficient documentation

## 2015-12-30 LAB — URINALYSIS COMPLETE WITH MICROSCOPIC (ARMC ONLY)
BACTERIA UA: NONE SEEN
Bilirubin Urine: NEGATIVE
Glucose, UA: NEGATIVE mg/dL
Hgb urine dipstick: NEGATIVE
KETONES UR: NEGATIVE mg/dL
LEUKOCYTES UA: NEGATIVE
Nitrite: NEGATIVE
PH: 5.5 (ref 5.0–8.0)
PROTEIN: NEGATIVE mg/dL
Specific Gravity, Urine: <1.005 — ABNORMAL LOW (ref 1.005–1.030)

## 2015-12-30 NOTE — ED Provider Notes (Signed)
[facility]    CSN: [id number] Arrival date & time: 12/30/15  1820  First Provider Contact:  None       History   Chief Complaint Chief Complaint  Patient presents with  . Urinary Tract Infection    HPI Felicia Daniels is a 75 y.o. female.   75 yo female with a c/o low back pain since this afternoon. Denies any injuries, falls, fevers, chills, abdominal pain.    The history is provided by the patient.    Past Medical History:  Diagnosis Date  . Cholelithiasis   . 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  Marland Kitchen Diverticulosis   . Fibrocystic disease of breast   . GERD (gastroesophageal reflux disease)   . Heart murmur   . Pancreatitis     Patient Active Problem List   Diagnosis Date Noted  . Pancreatitis 02/09/2015  . Acute pancreatitis 01/28/2015    Past Surgical History:  Procedure Laterality Date  . ABDOMINAL HYSTERECTOMY    . BREAST CYST ASPIRATION Left   . FINGER ARTHROPLASTY Right 09/20/2015   Procedure: FINGER ARTHROPLASTY RIGHT THUMB [place];  Surgeon: Leanor Kail, MD;  Location: [place];  Service: Orthopedics;  Laterality: Right;  . MENISCUS REPAIR Right   . PANCREAS SURGERY     blockage surgery    OB History    No data available       Home Medications    Prior to Admission medications   Medication Sig Start Date End Date Taking? Authorizing Provider  acetaminophen (TYLENOL) 500 MG tablet Take 500 mg by mouth every 6 (six) hours as needed.   Yes Historical Provider, MD  Cholecalciferol (VITAMIN D3) 1000 UNITS CAPS Take 1 tablet by mouth daily.   Yes Historical Provider, MD  Ferrous Fumarate (HEMOCYTE - 106 MG FE) 324 (106 Fe) MG TABS tablet Take 1 tablet by mouth.   Yes Historical Provider, MD  ibuprofen (ADVIL,MOTRIN) 200 MG tablet Take 200 mg by mouth every 6 (six) hours as needed.   Yes Historical Provider, MD  Multiple Vitamin  (MULTI-VITAMINS) TABS Take 1 tablet by mouth daily.   Yes Historical Provider, MD  omeprazole (PRILOSEC) 20 MG capsule Take 1 capsule by mouth daily. 01/07/15  Yes Historical Provider, MD  senna-docusate (SENOKOT-S) 8.6-50 MG tablet Take 1 tablet by mouth daily.   Yes Historical Provider, MD  NORCO 5-325 MG tablet Take 1-2 tablets by mouth every 6 (six) hours as needed for moderate pain. MAXIMUM TOTAL ACETAMINOPHEN DOSE IS 4000 MG PER DAY 09/20/15   Leanor Kail, MD  ursodiol (ACTIGALL) 300 MG capsule Take 1 capsule by mouth 2 (two) times daily. Reported on 09/11/2015 10/10/14   Historical Provider, MD    Family History Family History  Problem Relation Age of Onset  . Skin cancer Mother   . Breast cancer Daughter 70    Social History Social History  Substance Use Topics  . Smoking status: Never Smoker  . Smokeless tobacco: Never Used  . Alcohol use No     Allergies   Synthroid [levothyroxine sodium]; Keflex [cephalexin]; Penicillins; and Tape   Review of Systems Review of Systems   Physical Exam Triage Vital Signs ED Triage Vitals  Enc Vitals Group     BP 12/30/15 1905 (!) 151/62     Pulse Rate 12/30/15 1905 (!) 58     Resp 12/30/15 1905 16  Temp 12/30/15 1905 98 F (36.7 C)     Temp Source 12/30/15 1905 Oral     SpO2 12/30/15 1905 99 %     Weight 12/30/15 1905 148 lb (67.1 kg)     Height 12/30/15 1905 5\' 3"  (1.6 m)     Head Circumference --      Peak Flow --      Pain Score 12/30/15 1904 4     Pain Loc --      Pain Edu? --      Excl. in [place]? --    No data found.   Updated Vital Signs BP (!) 151/62 (BP Location: Left Arm)   Pulse (!) 58   Temp 98 F (36.7 C) (Oral)   Resp 16   Ht 5\' 3"  (1.6 m)   Wt 148 lb (67.1 kg)   SpO2 99%   BMI 26.22 kg/m   Visual Acuity Right Eye Distance:   Left Eye Distance:   Bilateral Distance:    Right Eye Near:   Left Eye Near:    Bilateral Near:     Physical Exam  Constitutional: She appears well-developed and  well-nourished. No distress.  Musculoskeletal: She exhibits tenderness (over the sacroiliac joints bilaterally). She exhibits no edema.       Lumbar back: She exhibits tenderness and spasm. She exhibits normal range of motion, no bony tenderness, no swelling, no edema, no deformity, no laceration, no pain and normal pulse.  Neurological: She is alert. She has normal reflexes. She displays normal reflexes. She exhibits normal muscle tone.  Skin: Skin is warm and dry. No rash noted. She is not diaphoretic. No erythema.  Nursing note and vitals reviewed.    UC Treatments / Results  Labs (all labs ordered are listed, but only abnormal results are displayed) Labs Reviewed  URINALYSIS COMPLETEWITH MICROSCOPIC ([place]) - Abnormal; Notable for the following:       Result Value   Specific Gravity, Urine <1.005 (*)    Squamous Epithelial / LPF 0-5 (*)    All other components within normal limits    EKG  EKG Interpretation None       Radiology No results found.  Procedures Procedures (including critical care time)  Medications Ordered in UC Medications - No data to display   Initial Impression / Assessment and Plan / UC Course  I have reviewed the triage vital signs and the nursing notes.  Pertinent labs & imaging results that were available during my care of the patient were reviewed by me and considered in my medical decision making (see chart for details).  Clinical Course      Final Clinical Impressions(s) / UC Diagnoses   Final diagnoses:  Sacroiliitis [facility])    New Prescriptions Discharge Medication List as of 12/30/2015  7:37 PM      1. Lab results and diagnosis reviewed with patient 2.Recommend supportive treatment with otc nsaids, rest, ice/heat 3. Follow-up prn if symptoms worsen or don't improve   Norval Gable, MD 12/30/15 2008

## 2015-12-30 NOTE — ED Triage Notes (Signed)
Patient states that symptoms started around lunchtime today. Patient states that she has lower abdominal pressure and low back pain with urinary urgency.

## 2015-12-31 ENCOUNTER — Encounter: Payer: Medicare Other | Admitting: Occupational Therapy

## 2016-01-02 ENCOUNTER — Ambulatory Visit: Payer: Medicare Other | Attending: Unknown Physician Specialty | Admitting: Occupational Therapy

## 2016-01-02 DIAGNOSIS — M25641 Stiffness of right hand, not elsewhere classified: Secondary | ICD-10-CM | POA: Diagnosis present

## 2016-01-02 DIAGNOSIS — M25531 Pain in right wrist: Secondary | ICD-10-CM | POA: Diagnosis present

## 2016-01-02 DIAGNOSIS — M79641 Pain in right hand: Secondary | ICD-10-CM | POA: Insufficient documentation

## 2016-01-02 DIAGNOSIS — M6281 Muscle weakness (generalized): Secondary | ICD-10-CM | POA: Insufficient documentation

## 2016-01-02 DIAGNOSIS — M25631 Stiffness of right wrist, not elsewhere classified: Secondary | ICD-10-CM | POA: Diagnosis present

## 2016-01-02 NOTE — Patient Instructions (Addendum)
Cont with joint  protection - and putty  Wrist extention - prayer stretch   For 2 wks daily and then every other day for 2 wks and then stop

## 2016-01-02 NOTE — Therapy (Signed)
[facility] [address], [place], [address] Phone: [phone]   Fax:  [phone]  Occupational Therapy Treatment  Patient Details  Name: Felicia Daniels MRN: [id number] Date of Birth: 25-Aug-1940 Referring Provider: Leanor Kail  Encounter Date: 01/02/2016      OT End of Session - 01/02/16 1152    Visit Number 8   Number of Visits 8   Date for OT Re-Evaluation 01/02/16   OT Start Time 1117   OT Stop Time 1153   OT Time Calculation (min) 36 min   Activity Tolerance Patient tolerated treatment well   Behavior During Therapy [facility] for tasks assessed/performed      Past Medical History:  Diagnosis Date  . Cholelithiasis   . 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  Marland Kitchen Diverticulosis   . Fibrocystic disease of breast   . GERD (gastroesophageal reflux disease)   . Heart murmur   . Pancreatitis     Past Surgical History:  Procedure Laterality Date  . ABDOMINAL HYSTERECTOMY    . BREAST CYST ASPIRATION Left   . FINGER ARTHROPLASTY Right 09/20/2015   Procedure: FINGER ARTHROPLASTY RIGHT THUMB [place];  Surgeon: Leanor Kail, MD;  Location: [place];  Service: Orthopedics;  Laterality: Right;  . MENISCUS REPAIR Right   . PANCREAS SURGERY     blockage surgery    There were no vitals filed for this visit.      Subjective Assessment - 01/02/16 1122    Subjective  The only thing that bother is writing - wrist looks better - can prune bushes , cutting lawn , open bottels    Patient Stated Goals Get the strenght, pain better - complete mobility    Currently in Pain? No/denies            [facility] OT Assessment - 01/02/16 0001      AROM   Right Wrist Extension 63 Degrees   Right Wrist Flexion 73 Degrees     Strength   Right Hand Grip (lbs) 30   Right Hand Lateral Pinch 10 lbs   Right Hand 3 Point  Pinch 10 lbs   Left Hand Grip (lbs) 42   Left Hand Lateral Pinch 13 lbs   Left Hand 3 Point Pinch 13 lbs     Right Hand AROM   R Thumb MCP 0-60 45 Degrees   R Thumb Radial ABduction/ADduction 0-55 47   R Thumb Palmar ABduction/ADduction 0-45 50      Measurements taken for ROM at thumb , wrist - see flowsheet-  M/M strength in wrist in all planes 5/5   Pain only when lifting some thing heavy - but only 1/10   PRWHE done - see goals   Great progress  Reviewed with pt  HEP to cont with for wrist ext, putty and reinforce again use of joint protection                     OT Education - 01/02/16 1156    Education provided Yes   Education Details HEP   Person(s) Educated Patient   Methods Demonstration;Tactile cues;Verbal cues   Comprehension Returned demonstration;Verbalized understanding;Verbal cues required          OT Short Term Goals - 01/02/16 1151      OT SHORT TERM GOAL #1   Title Pain  on PRWHE decrease by at least 10 points    Baseline pain on PRWHE at eval18/50 - improved to 3/10   Status Achieved     OT SHORT TERM GOAL #2   Title AROM of R thumb increase in flexion at MP by 10 degrees and CMC PA and RA by at least 5 degrees to pick up 1/2 cup    Baseline MP flexion to 45 , and PA and RA improve - see flowsheet - can do function    Status Achieved     OT SHORT TERM GOAL #3   Title pt to be ind in wearing of splnts to decreaes pain and increase functional use R dominant hand    Baseline no splint - has CMC neoprene if she needs it    Status Achieved           OT Long Term Goals - 01/02/16 1141      OT LONG TERM GOAL #1   Title Function on PRHWE improve with  at least 10 points    Baseline Function improve to 0/10   Status Achieved     OT LONG TERM GOAL #2   Title R Grip strength improve to more than 50% compare the L to hold cup , grip ADL's objects with no pain    Baseline Grip 30 lbs R, L 42   Status Achieved     OT LONG TERM  GOAL #3   Title R Wrist AROM improve to [facility] with no pain to use in lifting , carring and pulling objects    Baseline see flowsheet   Status Achieved     OT LONG TERM GOAL #4   Title 3 poitn and lat grip improve by 2-3 lbs with or without  splint to turn key , write with less pain    Baseline R Lat and 3 point improve to 10 lbs , L 13    Status Achieved               Plan - 01/02/16 1153    Clinical Impression Statement Pt made great progress in  pain on R hand - increase ROM at  R thumband wrist - great progress in grip and prehension strength - and met all goals - pt use hand in all activities and feels much better than prior to  surgery - pt discharge from OT services and can cont with HEP for about 2-4 more wks and gradually stop    OT Treatment/Interventions Therapeutic exercise;Patient/family education;Self-care/ADL training;Manual Therapy;Splinting;DME and/or AE instruction;Parrafin;Fluidtherapy;Passive range of motion   Plan discharge with HEP    OT Home Exercise Plan see pt instruction    Consulted and Agree with Plan of Care Patient      Patient will benefit from skilled therapeutic intervention in order to improve the following deficits and impairments:     Visit Diagnosis: Pain in right hand  Stiffness of right hand, not elsewhere classified  Muscle weakness (generalized)  Stiffness of right wrist, not elsewhere classified  Pain in right wrist    Problem List Patient Active Problem List   Diagnosis Date Noted  . Pancreatitis 02/09/2015  . Acute pancreatitis 01/28/2015    Rosalyn Gess OTR/L,CLT  01/02/2016, 12:32 PM  [place] [facility] [address] [place], [address] Phone: [phone]   Fax:  [phone]  Name: Felicia Daniels MRN: [id number] Date of Birth: [birth year]

## 2016-03-09 ENCOUNTER — Other Ambulatory Visit: Payer: Self-pay | Admitting: Internal Medicine

## 2016-03-09 DIAGNOSIS — Z1231 Encounter for screening mammogram for malignant neoplasm of breast: Secondary | ICD-10-CM

## 2016-04-13 ENCOUNTER — Ambulatory Visit
Admission: RE | Admit: 2016-04-13 | Discharge: 2016-04-13 | Disposition: A | Discharge status: Home / Self Care | Payer: Medicare Other | Source: Ambulatory Visit | Attending: Internal Medicine | Admitting: Internal Medicine

## 2016-04-13 DIAGNOSIS — Z1231 Encounter for screening mammogram for malignant neoplasm of breast: Secondary | ICD-10-CM | POA: Insufficient documentation

## 2016-09-04 ENCOUNTER — Ambulatory Visit
Admission: EM | Admit: 2016-09-04 | Discharge: 2016-09-04 | Disposition: A | Discharge status: Home / Self Care | Payer: Medicare Other | Attending: Family Medicine | Admitting: Family Medicine

## 2016-09-04 DIAGNOSIS — R69 Illness, unspecified: Secondary | ICD-10-CM

## 2016-09-04 DIAGNOSIS — J111 Influenza due to unidentified influenza virus with other respiratory manifestations: Secondary | ICD-10-CM

## 2016-09-04 MED ORDER — OSELTAMIVIR PHOSPHATE 75 MG PO CAPS: 75.0000 mg | ORAL_CAPSULE | Freq: Two times a day (BID) | ORAL | 0 refills | Status: DC

## 2016-09-04 MED ORDER — HYDROCOD POLST-CPM POLST ER 10-8 MG/5ML PO SUER: 5.0000 mL | Freq: Two times a day (BID) | ORAL | 0 refills | Status: DC | PRN

## 2016-09-04 NOTE — ED Provider Notes (Signed)
[facility]    CSN: [id number] Arrival date & time: 09/04/16  0932     History   Chief Complaint Chief Complaint  Patient presents with  . Cough  . Fever    HPI Felicia Daniels is a 76 y.o. female.   The history is provided by the patient.  Cough  Associated symptoms: fever and myalgias   Associated symptoms: no wheezing   Fever  Associated symptoms: congestion, cough and myalgias   URI  Presenting symptoms: congestion, cough, fatigue and fever   Severity:  Moderate Onset quality:  Sudden Duration:  3 days Timing:  Constant Progression:  Worsening Chronicity:  New Relieved by:  Nothing Associated symptoms: myalgias   Associated symptoms: no wheezing   Risk factors: not elderly, no chronic cardiac disease, no chronic kidney disease, no chronic respiratory disease, no diabetes mellitus, no immunosuppression, no recent illness, no recent travel and no sick contacts     Past Medical History:  Diagnosis Date  . Cholelithiasis   . 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  Marland Kitchen Diverticulosis   . Fibrocystic disease of breast   . GERD (gastroesophageal reflux disease)   . Heart murmur   . Pancreatitis     Patient Active Problem List   Diagnosis Date Noted  . Pancreatitis 02/09/2015  . Acute pancreatitis 01/28/2015    Past Surgical History:  Procedure Laterality Date  . ABDOMINAL HYSTERECTOMY    . BREAST CYST ASPIRATION Left   . FINGER ARTHROPLASTY Right 09/20/2015   Procedure: FINGER ARTHROPLASTY RIGHT THUMB [place];  Surgeon: Leanor Kail, MD;  Location: [place];  Service: Orthopedics;  Laterality: Right;  . MENISCUS REPAIR Right   . PANCREAS SURGERY     blockage surgery    OB History    No data available       Home Medications    Prior to Admission medications   Medication Sig Start Date End Date Taking? Authorizing Provider  acetaminophen  (TYLENOL) 500 MG tablet Take 500 mg by mouth every 6 (six) hours as needed.    Historical Provider, MD  chlorpheniramine-HYDROcodone (TUSSIONEX PENNKINETIC ER) 10-8 MG/5ML SUER Take 5 mLs by mouth every 12 (twelve) hours as needed. 09/04/16   Norval Gable, MD  Cholecalciferol (VITAMIN D3) 1000 UNITS CAPS Take 1 tablet by mouth daily.    Historical Provider, MD  Ferrous Fumarate (HEMOCYTE - 106 MG FE) 324 (106 Fe) MG TABS tablet Take 1 tablet by mouth.    Historical Provider, MD  ibuprofen (ADVIL,MOTRIN) 200 MG tablet Take 200 mg by mouth every 6 (six) hours as needed.    Historical Provider, MD  Multiple Vitamin (MULTI-VITAMINS) TABS Take 1 tablet by mouth daily.    Historical Provider, MD  NORCO 5-325 MG tablet Take 1-2 tablets by mouth every 6 (six) hours as needed for moderate pain. MAXIMUM TOTAL ACETAMINOPHEN DOSE IS 4000 MG PER DAY 09/20/15   Leanor Kail, MD  omeprazole (PRILOSEC) 20 MG capsule Take 1 capsule by mouth daily. 01/07/15   Historical Provider, MD  oseltamivir (TAMIFLU) 75 MG capsule Take 1 capsule (75 mg total) by mouth 2 (two) times daily. 09/04/16   Norval Gable, MD  senna-docusate (SENOKOT-S) 8.6-50 MG tablet Take 1 tablet by mouth daily.    Historical Provider, MD  ursodiol (ACTIGALL) 300 MG capsule Take 1 capsule by mouth 2 (two) times daily. Reported on 09/11/2015  10/10/14   Historical Provider, MD    Family History Family History  Problem Relation Age of Onset  . Skin cancer Mother   . Breast cancer Daughter 36    Social History Social History  Substance Use Topics  . Smoking status: Never Smoker  . Smokeless tobacco: Never Used  . Alcohol use No     Allergies   Synthroid [levothyroxine sodium]; Ampicillin; Keflex [cephalexin]; Penicillins; and Tape   Review of Systems Review of Systems  Constitutional: Positive for fatigue and fever.  HENT: Positive for congestion.   Respiratory: Positive for cough. Negative for wheezing.   Musculoskeletal: Positive for  myalgias.     Physical Exam Triage Vital Signs ED Triage Vitals  Enc Vitals Group     BP 09/04/16 0950 (!) 137/56     Pulse Rate 09/04/16 0950 78     Resp 09/04/16 0950 18     Temp 09/04/16 0950 99.6 F (37.6 C)     Temp Source 09/04/16 0950 Oral     SpO2 09/04/16 0950 99 %     Weight 09/04/16 0950 155 lb (70.3 kg)     Height 09/04/16 0950 5\' 3"  (1.6 m)     Head Circumference --      Peak Flow --      Pain Score 09/04/16 1033 2     Pain Loc --      Pain Edu? --      Excl. in [place]? --    No data found.   Updated Vital Signs BP (!) 137/56 (BP Location: Left Arm)   Pulse 78   Temp 99.6 F (37.6 C) (Oral)   Resp 18   Ht 5\' 3"  (1.6 m)   Wt 155 lb (70.3 kg)   SpO2 99%   BMI 27.46 kg/m   Visual Acuity Right Eye Distance:   Left Eye Distance:   Bilateral Distance:    Right Eye Near:   Left Eye Near:    Bilateral Near:     Physical Exam  Constitutional: She appears well-developed and well-nourished. No distress.  HENT:  Head: Normocephalic and atraumatic.  Right Ear: Tympanic membrane, external ear and ear canal normal.  Left Ear: Tympanic membrane, external ear and ear canal normal.  Nose: Rhinorrhea present. No mucosal edema, nose lacerations, sinus tenderness, nasal deformity, septal deviation or nasal septal hematoma. No epistaxis.  No foreign bodies. Right sinus exhibits no maxillary sinus tenderness and no frontal sinus tenderness. Left sinus exhibits no maxillary sinus tenderness and no frontal sinus tenderness.  Mouth/Throat: Uvula is midline, oropharynx is clear and moist and mucous membranes are normal. No oropharyngeal exudate.  Eyes: Conjunctivae and EOM are normal. Pupils are equal, round, and reactive to light. Right eye exhibits no discharge. Left eye exhibits no discharge. No scleral icterus.  Neck: Normal range of motion. Neck supple. No thyromegaly present.  Cardiovascular: Normal rate, regular rhythm and normal heart sounds.   Pulmonary/Chest: Effort  normal and breath sounds normal. No respiratory distress. She has no wheezes. She has no rales.  Lymphadenopathy:    She has no cervical adenopathy.  Skin: She is not diaphoretic.  Nursing note and vitals reviewed.    UC Treatments / Results  Labs (all labs ordered are listed, but only abnormal results are displayed) Labs Reviewed - No data to display  EKG  EKG Interpretation None       Radiology No results found.  Procedures Procedures (including critical care time)  Medications Ordered in UC  Medications - No data to display   Initial Impression / Assessment and Plan / UC Course  I have reviewed the triage vital signs and the nursing notes.  Pertinent labs & imaging results that were available during my care of the patient were reviewed by me and considered in my medical decision making (see chart for details).       Final Clinical Impressions(s) / UC Diagnoses   Final diagnoses:  Influenza-like illness    New Prescriptions Discharge Medication List as of 09/04/2016 10:31 AM    START taking these medications   Details  chlorpheniramine-HYDROcodone (TUSSIONEX PENNKINETIC ER) 10-8 MG/5ML SUER Take 5 mLs by mouth every 12 (twelve) hours as needed., Starting Fri 09/04/2016, Normal    oseltamivir (TAMIFLU) 75 MG capsule Take 1 capsule (75 mg total) by mouth 2 (two) times daily., Starting Fri 09/04/2016, Normal       1. diagnosis reviewed with patient 2. rx as per orders above; reviewed possible side effects, interactions, risks and benefits  3. Recommend supportive treatment with rest, fluids 4. Follow-up prn if symptoms worsen or don't improve   Norval Gable, MD 09/04/16 1154

## 2016-09-04 NOTE — ED Triage Notes (Signed)
Pt reports non-productive cough, clear nasal drainage, body aches, scratchy throat, and subjective fever.

## 2016-09-10 ENCOUNTER — Ambulatory Visit: Payer: Medicare Other

## 2016-09-10 ENCOUNTER — Ambulatory Visit
Admission: EM | Admit: 2016-09-10 | Discharge: 2016-09-10 | Disposition: A | Discharge status: Home / Self Care | Payer: Medicare Other | Attending: Emergency Medicine | Admitting: Emergency Medicine

## 2016-09-10 DIAGNOSIS — I700 Atherosclerosis of aorta: Secondary | ICD-10-CM | POA: Insufficient documentation

## 2016-09-10 DIAGNOSIS — J189 Pneumonia, unspecified organism: Secondary | ICD-10-CM | POA: Diagnosis not present

## 2016-09-10 DIAGNOSIS — J181 Lobar pneumonia, unspecified organism: Secondary | ICD-10-CM | POA: Diagnosis not present

## 2016-09-10 DIAGNOSIS — Z79899 Other long term (current) drug therapy: Secondary | ICD-10-CM | POA: Insufficient documentation

## 2016-09-10 DIAGNOSIS — R05 Cough: Secondary | ICD-10-CM | POA: Diagnosis present

## 2016-09-10 DIAGNOSIS — I517 Cardiomegaly: Secondary | ICD-10-CM | POA: Insufficient documentation

## 2016-09-10 MED ORDER — BENZONATATE 200 MG PO CAPS: 200.0000 mg | ORAL_CAPSULE | Freq: Three times a day (TID) | ORAL | 0 refills | Status: DC

## 2016-09-10 MED ORDER — AZITHROMYCIN 250 MG PO TABS: ORAL_TABLET | ORAL | 0 refills | Status: DC

## 2016-09-10 NOTE — ED Provider Notes (Signed)
CSN: [id number]     Arrival date & time 09/10/16  1920 History   None    Chief Complaint  Patient presents with  . Cough   (Consider location/radiation/quality/duration/timing/severity/associated sxs/prior Treatment) HPI  76 year old female who seen here on 09/04/2016 is with flulike illness placed on Tamiflu. The Tamiflu but has only felt worse as time has progressed. They have a cough and productive sputum that looks like "gravy". His had low-grade temperatures of 99-100. He continues to have fatigue body aches sore throat.      Past Medical History:  Diagnosis Date  . Cholelithiasis   . 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  Marland Kitchen Diverticulosis   . Fibrocystic disease of breast   . GERD (gastroesophageal reflux disease)   . Heart murmur   . Pancreatitis    Past Surgical History:  Procedure Laterality Date  . ABDOMINAL HYSTERECTOMY    . BREAST CYST ASPIRATION Left   . FINGER ARTHROPLASTY Right 09/20/2015   Procedure: FINGER ARTHROPLASTY RIGHT THUMB [place];  Surgeon: Leanor Kail, MD;  Location: [place];  Service: Orthopedics;  Laterality: Right;  . MENISCUS REPAIR Right   . PANCREAS SURGERY     blockage surgery   Family History  Problem Relation Age of Onset  . Skin cancer Mother   . Breast cancer Daughter 30   Social History  Substance Use Topics  . Smoking status: Never Smoker  . Smokeless tobacco: Never Used  . Alcohol use No   OB History    No data available     Review of Systems  Constitutional: Positive for activity change, appetite change, chills, fatigue and fever.  HENT: Positive for congestion, postnasal drip and sore throat.   Respiratory: Positive for cough. Negative for shortness of breath, wheezing and stridor.   All other systems reviewed and are negative.   Allergies  Synthroid [levothyroxine sodium]; Ampicillin; Keflex [cephalexin];  Penicillins; and Tape  Home Medications   Prior to Admission medications   Medication Sig Start Date End Date Taking? Authorizing Provider  acetaminophen (TYLENOL) 500 MG tablet Take 500 mg by mouth every 6 (six) hours as needed.    Historical Provider, MD  azithromycin (ZITHROMAX Z-PAK) 250 MG tablet Use as per package instructions 09/10/16   Lorin Picket, PA-C  benzonatate (TESSALON) 200 MG capsule Take 1 capsule (200 mg total) by mouth every 8 (eight) hours. 09/10/16   Lorin Picket, PA-C  chlorpheniramine-HYDROcodone (TUSSIONEX PENNKINETIC ER) 10-8 MG/5ML SUER Take 5 mLs by mouth every 12 (twelve) hours as needed. 09/04/16   Norval Gable, MD  Cholecalciferol (VITAMIN D3) 1000 UNITS CAPS Take 1 tablet by mouth daily.    Historical Provider, MD  Ferrous Fumarate (HEMOCYTE - 106 MG FE) 324 (106 Fe) MG TABS tablet Take 1 tablet by mouth.    Historical Provider, MD  ibuprofen (ADVIL,MOTRIN) 200 MG tablet Take 200 mg by mouth every 6 (six) hours as needed.    Historical Provider, MD  Multiple Vitamin (MULTI-VITAMINS) TABS Take 1 tablet by mouth daily.    Historical Provider, MD  NORCO 5-325 MG tablet Take 1-2 tablets by mouth every 6 (six) hours as needed for moderate pain. MAXIMUM TOTAL ACETAMINOPHEN DOSE IS 4000 MG PER DAY 09/20/15   Leanor Kail, MD  omeprazole (PRILOSEC) 20 MG capsule Take 1 capsule by mouth daily. 01/07/15   Historical Provider, MD  oseltamivir (TAMIFLU) 75  MG capsule Take 1 capsule (75 mg total) by mouth 2 (two) times daily. 09/04/16   Norval Gable, MD  senna-docusate (SENOKOT-S) 8.6-50 MG tablet Take 1 tablet by mouth daily.    Historical Provider, MD  ursodiol (ACTIGALL) 300 MG capsule Take 1 capsule by mouth 2 (two) times daily. Reported on 09/11/2015 10/10/14   Historical Provider, MD   Meds Ordered and Administered this Visit  Medications - No data to display  BP (!) 131/51 (BP Location: Left Arm)   Pulse 78   Temp 99.1 F (37.3 C) (Oral)   Resp 18   Wt 155 lb  (70.3 kg)   SpO2 97%   BMI 27.46 kg/m  No data found.   Physical Exam  Constitutional: She is oriented to person, place, and time. She appears well-developed and well-nourished. No distress.  HENT:  Head: Normocephalic and atraumatic.  Nose: Nose normal.  Mouth/Throat: Oropharynx is clear and moist. No oropharyngeal exudate.  Both TMs are dull  Eyes: Pupils are equal, round, and reactive to light.  Neck: Normal range of motion.  Pulmonary/Chest: Effort normal. She has rales.  The patient has non-tussive crackles in the left base.  Musculoskeletal: Normal range of motion.  Lymphadenopathy:    She has no cervical adenopathy.  Neurological: She is alert and oriented to person, place, and time.  Skin: Skin is warm and dry. She is not diaphoretic.  Psychiatric: She has a normal mood and affect. Her behavior is normal. Judgment and thought content normal.  Nursing note and vitals reviewed.   Urgent Care Course     Procedures (including critical care time)  Labs Review Labs Reviewed - No data to display  Imaging Review Dg Chest 2 View  Result Date: 09/10/2016 CLINICAL DATA:  Initial evaluation for acute cough, recent fluid. EXAM: CHEST  2 VIEW COMPARISON:  Prior radiograph from 09/22/2011. FINDINGS: Mild cardiomegaly. Mediastinal silhouette within normal limits. Aortic atherosclerosis. Lungs normally inflated. Mild scattered peribronchial thickening. There is patchy opacity within the peripheral right upper lobe, somewhat concerning for possible pneumonia given symptoms. No other focal airspace disease. No pulmonary edema or pleural effusion. No pneumothorax. No acute osseous abnormality. IMPRESSION: 1. Patchy opacity within the peripheral right upper lobe, concerning for possible pneumonia given patient's symptoms. 2. Mild cardiomegaly. 3. Aortic atherosclerosis. Electronically Signed   By: Jeannine Boga M.D.   On: 09/10/2016 20:38     Visual Acuity Review  Right Eye  Distance:   Left Eye Distance:   Bilateral Distance:    Right Eye Near:   Left Eye Near:    Bilateral Near:         MDM   1. Community acquired pneumonia of right upper lobe of lung [facility])    Discharge Medication List as of 09/10/2016  8:51 PM    START taking these medications   Details  benzonatate (TESSALON) 200 MG capsule Take 1 capsule (200 mg total) by mouth every 8 (eight) hours., Starting Thu 09/10/2016, Print    azithromycin (ZITHROMAX Z-PAK) 250 MG tablet Use as per package instructions, Normal      Plan: 1. Test/x-ray results and diagnosis reviewed with patient 2. rx as per orders; risks, benefits, potential side effects reviewed with patient 3. Recommend supportive treatment with Rest and fluids. Use Tylenol or Motrin for body aches and fever. If not improving should follow-up with your primary care physician. You are improving will need to follow-up with her primary care for 6 week for proof of cure 4.  F/u prn if symptoms worsen or don't improve     Lorin Picket, PA-C 09/10/16 2112

## 2016-09-10 NOTE — ED Triage Notes (Signed)
Pt was dx with flu last week, and finished the Tamiflu, she is here today because she doesn't feel. Her temp last night was 99 and her cough is still persistent and she is about out of the cough syrup.

## 2017-03-12 ENCOUNTER — Other Ambulatory Visit: Payer: Self-pay | Admitting: Internal Medicine

## 2017-03-12 DIAGNOSIS — Z1231 Encounter for screening mammogram for malignant neoplasm of breast: Secondary | ICD-10-CM

## 2017-04-07 ENCOUNTER — Other Ambulatory Visit: Payer: Self-pay | Admitting: Orthopedic Surgery

## 2017-04-07 DIAGNOSIS — M2392 Unspecified internal derangement of left knee: Secondary | ICD-10-CM

## 2017-04-07 DIAGNOSIS — M1712 Unilateral primary osteoarthritis, left knee: Secondary | ICD-10-CM

## 2017-04-07 DIAGNOSIS — M654 Radial styloid tenosynovitis [de Quervain]: Secondary | ICD-10-CM

## 2017-04-08 ENCOUNTER — Other Ambulatory Visit: Payer: Self-pay | Admitting: Orthopedic Surgery

## 2017-04-08 DIAGNOSIS — M1712 Unilateral primary osteoarthritis, left knee: Secondary | ICD-10-CM

## 2017-04-08 DIAGNOSIS — M654 Radial styloid tenosynovitis [de Quervain]: Secondary | ICD-10-CM

## 2017-04-08 DIAGNOSIS — M2392 Unspecified internal derangement of left knee: Secondary | ICD-10-CM

## 2017-04-14 ENCOUNTER — Ambulatory Visit
Admission: RE | Admit: 2017-04-14 | Discharge: 2017-04-14 | Disposition: A | Discharge status: Home / Self Care | Payer: Medicare Other | Source: Ambulatory Visit | Attending: Internal Medicine | Admitting: Internal Medicine

## 2017-04-14 DIAGNOSIS — Z1231 Encounter for screening mammogram for malignant neoplasm of breast: Secondary | ICD-10-CM

## 2017-04-15 ENCOUNTER — Ambulatory Visit
Admission: RE | Admit: 2017-04-15 | Discharge: 2017-04-15 | Disposition: A | Discharge status: Home / Self Care | Payer: Medicare Other | Source: Ambulatory Visit | Attending: Orthopedic Surgery | Admitting: Orthopedic Surgery

## 2017-04-15 DIAGNOSIS — S83282A Other tear of lateral meniscus, current injury, left knee, initial encounter: Secondary | ICD-10-CM | POA: Insufficient documentation

## 2017-04-15 DIAGNOSIS — M659 Synovitis and tenosynovitis, unspecified: Secondary | ICD-10-CM | POA: Diagnosis not present

## 2017-04-15 DIAGNOSIS — M1712 Unilateral primary osteoarthritis, left knee: Secondary | ICD-10-CM | POA: Diagnosis present

## 2017-04-15 DIAGNOSIS — M2392 Unspecified internal derangement of left knee: Secondary | ICD-10-CM

## 2017-04-15 DIAGNOSIS — X58XXXA Exposure to other specified factors, initial encounter: Secondary | ICD-10-CM | POA: Insufficient documentation

## 2017-04-15 DIAGNOSIS — M25462 Effusion, left knee: Secondary | ICD-10-CM | POA: Diagnosis not present

## 2017-04-15 DIAGNOSIS — M654 Radial styloid tenosynovitis [de Quervain]: Secondary | ICD-10-CM

## 2017-10-02 ENCOUNTER — Ambulatory Visit
Admission: EM | Admit: 2017-10-02 | Discharge: 2017-10-02 | Disposition: A | Discharge status: Home / Self Care | Payer: Medicare Other | Attending: Family Medicine | Admitting: Family Medicine

## 2017-10-02 ENCOUNTER — Encounter: Payer: Self-pay | Admitting: Gynecology

## 2017-10-02 DIAGNOSIS — L02422 Furuncle of left axilla: Secondary | ICD-10-CM

## 2017-10-02 MED ORDER — DOXYCYCLINE HYCLATE 100 MG PO TABS: 100.0000 mg | ORAL_TABLET | Freq: Two times a day (BID) | ORAL | 0 refills | Status: DC

## 2017-10-02 NOTE — ED Provider Notes (Signed)
[facility]    CSN: [id number] Arrival date & time: 10/02/17  1547     History   Chief Complaint Chief Complaint  Patient presents with  . Abscess    HPI Felicia Daniels is a 77 y.o. female.   77 yo female with a c/o left axilla skin with red, tender lump that has been slightly draining some pus. Denies any fevers or chills.   The history is provided by the patient.  Abscess    Past Medical History:  Diagnosis Date  . Cholelithiasis   . 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  Marland Kitchen Diverticulosis   . Fibrocystic disease of breast   . GERD (gastroesophageal reflux disease)   . Heart murmur   . Pancreatitis     Patient Active Problem List   Diagnosis Date Noted  . Pancreatitis 02/09/2015  . Acute pancreatitis 01/28/2015    Past Surgical History:  Procedure Laterality Date  . ABDOMINAL HYSTERECTOMY    . BREAST CYST ASPIRATION Left   . FINGER ARTHROPLASTY Right 09/20/2015   Procedure: FINGER ARTHROPLASTY RIGHT THUMB [place];  Surgeon: Leanor Kail, MD;  Location: [place];  Service: Orthopedics;  Laterality: Right;  . MENISCUS REPAIR Right   . PANCREAS SURGERY     blockage surgery    OB History   None      Home Medications    Prior to Admission medications   Medication Sig Start Date End Date Taking? Authorizing Provider  acetaminophen (TYLENOL) 500 MG tablet Take 500 mg by mouth every 6 (six) hours as needed.   Yes [provider]  Cholecalciferol (VITAMIN D3) 1000 UNITS CAPS Take 1 tablet by mouth daily.   Yes [provider]  Ferrous Fumarate (HEMOCYTE - 106 MG FE) 324 (106 Fe) MG TABS tablet Take 1 tablet by mouth.   Yes [provider]  ibuprofen (ADVIL,MOTRIN) 200 MG tablet Take 200 mg by mouth every 6 (six) hours as needed.   Yes [provider]  Multiple Vitamin (MULTI-VITAMINS) TABS Take 1 tablet by mouth  daily.   Yes [provider]  omeprazole (PRILOSEC) 20 MG capsule Take 1 capsule by mouth daily. 01/07/15  Yes [provider]  azithromycin (ZITHROMAX Z-PAK) 250 MG tablet Use as per package instructions 09/10/16   Lorin Picket, PA-C  benzonatate (TESSALON) 200 MG capsule Take 1 capsule (200 mg total) by mouth every 8 (eight) hours. 09/10/16   Lorin Picket, PA-C  chlorpheniramine-HYDROcodone (TUSSIONEX PENNKINETIC ER) 10-8 MG/5ML SUER Take 5 mLs by mouth every 12 (twelve) hours as needed. 09/04/16   Norval Gable, MD  doxycycline (VIBRA-TABS) 100 MG tablet Take 1 tablet (100 mg total) by mouth 2 (two) times daily. 10/02/17   Norval Gable, MD  NORCO 5-325 MG tablet Take 1-2 tablets by mouth every 6 (six) hours as needed for moderate pain. MAXIMUM TOTAL ACETAMINOPHEN DOSE IS 4000 MG PER DAY 09/20/15   Leanor Kail, MD  oseltamivir (TAMIFLU) 75 MG capsule Take 1 capsule (75 mg total) by mouth 2 (two) times daily. 09/04/16   Norval Gable, MD  senna-docusate (SENOKOT-S) 8.6-50 MG tablet Take 1 tablet by mouth daily.    [provider]  ursodiol (ACTIGALL) 300 MG capsule Take 1 capsule by mouth 2 (two) times daily. Reported on 09/11/2015 10/10/14   [provider]    Family History Family History  Problem Relation Age of Onset  . Skin cancer Mother   . Breast cancer Daughter 55    Social History Social History   Tobacco Use  . Smoking status: Never Smoker  . Smokeless tobacco: Never Used  Substance Use Topics  . Alcohol use: No    Alcohol/week: 0.0 oz  . Drug use: No     Allergies   Synthroid [levothyroxine sodium]; Ampicillin; Keflex [cephalexin]; Penicillins; and Tape   Review of Systems Review of Systems   Physical Exam Triage Vital Signs ED Triage Vitals  Enc Vitals Group     BP 10/02/17 1610 (!) 142/42     Pulse Rate 10/02/17 1610 72     Resp 10/02/17 1610 16     Temp 10/02/17 1610 98.7 F (37.1 C)     Temp Source 10/02/17  1610 Oral     SpO2 10/02/17 1610 100 %     Weight 10/02/17 1609 150 lb (68 kg)     Height --      Head Circumference --      Peak Flow --      Pain Score 10/02/17 1609 1     Pain Loc --      Pain Edu? --      Excl. in [place]? --    No data found.  Updated Vital Signs BP (!) 142/42 (BP Location: Left Arm)   Pulse 72   Temp 98.7 F (37.1 C) (Oral)   Resp 16   Wt 150 lb (68 kg)   SpO2 100%   BMI 26.57 kg/m   Visual Acuity Right Eye Distance:   Left Eye Distance:   Bilateral Distance:    Right Eye Near:   Left Eye Near:    Bilateral Near:     Physical Exam  Constitutional: She appears well-developed and well-nourished. No distress.  Skin: She is not diaphoretic. There is erythema.  approx 1.5-2cm erythematous, tender, nodule on left lower axilla area   Nursing note and vitals reviewed.    UC Treatments / Results  Labs (all labs ordered are listed, but only abnormal results are displayed) Labs Reviewed - No data to display  EKG None  Radiology No results found.  Procedures Procedures (including critical care time)  Medications Ordered in UC Medications - No data to display  Initial Impression / Assessment and Plan / UC Course  I have reviewed the triage vital signs and the nursing notes.  Pertinent labs & imaging results that were available during my care of the patient were reviewed by me and considered in my medical decision making (see chart for details).      Final Clinical Impressions(s) / UC Diagnoses   Final diagnoses:  Furuncle of left axilla     Discharge Instructions     Warm compresses to area every 2-3 hours for 20 minutes    ED Prescriptions    Medication Sig Dispense Auth. Provider   doxycycline (VIBRA-TABS) 100 MG tablet Take 1 tablet (100 mg total) by mouth 2 (two) times daily. 20 tablet Norval Gable, MD     1. diagnosis reviewed with patient 2. rx as per orders above; reviewed possible side effects, interactions, risks and  benefits  3. Recommend supportive treatment with warm compresses, otc analgesics 4. Follow-up prn if symptoms worsen or don't improve   Controlled Substance Prescriptions [place] Controlled Substance Registry consulted? Not Applicable   Norval Gable, MD 10/02/17 [phone]

## 2017-10-02 NOTE — Discharge Instructions (Signed)
Warm compresses to area every 2-3 hours for 20 minutes

## 2017-10-02 NOTE — ED Triage Notes (Signed)
Patent c/o left axillary abscess x 1 week ago.

## 2018-02-08 ENCOUNTER — Other Ambulatory Visit: Payer: Self-pay | Admitting: Internal Medicine

## 2018-02-08 DIAGNOSIS — R1013 Epigastric pain: Secondary | ICD-10-CM

## 2018-02-11 ENCOUNTER — Ambulatory Visit
Admission: RE | Admit: 2018-02-11 | Discharge: 2018-02-11 | Disposition: A | Discharge status: Home / Self Care | Payer: Medicare Other | Source: Ambulatory Visit | Attending: Internal Medicine | Admitting: Internal Medicine

## 2018-02-11 ENCOUNTER — Other Ambulatory Visit
Admission: RE | Admit: 2018-02-11 | Discharge: 2018-02-11 | Disposition: A | Discharge status: Home / Self Care | Payer: Medicare Other | Source: Ambulatory Visit | Attending: Internal Medicine | Admitting: Internal Medicine

## 2018-02-11 DIAGNOSIS — R933 Abnormal findings on diagnostic imaging of other parts of digestive tract: Secondary | ICD-10-CM | POA: Insufficient documentation

## 2018-02-11 DIAGNOSIS — K8689 Other specified diseases of pancreas: Secondary | ICD-10-CM | POA: Diagnosis not present

## 2018-02-11 DIAGNOSIS — R1013 Epigastric pain: Secondary | ICD-10-CM | POA: Insufficient documentation

## 2018-02-11 DIAGNOSIS — I700 Atherosclerosis of aorta: Secondary | ICD-10-CM | POA: Insufficient documentation

## 2018-02-11 DIAGNOSIS — I10 Essential (primary) hypertension: Secondary | ICD-10-CM | POA: Diagnosis present

## 2018-02-11 DIAGNOSIS — Z9049 Acquired absence of other specified parts of digestive tract: Secondary | ICD-10-CM | POA: Diagnosis not present

## 2018-02-11 DIAGNOSIS — D1771 Benign lipomatous neoplasm of kidney: Secondary | ICD-10-CM | POA: Insufficient documentation

## 2018-02-11 LAB — COMPREHENSIVE METABOLIC PANEL
ALBUMIN: 3.9 g/dL (ref 3.5–5.0)
ALT: 45 U/L — ABNORMAL HIGH (ref 0–44)
ANION GAP: 9 (ref 5–15)
AST: 50 U/L — ABNORMAL HIGH (ref 15–41)
Alkaline Phosphatase: 259 U/L — ABNORMAL HIGH (ref 38–126)
BUN: 18 mg/dL (ref 8–23)
CALCIUM: 9.5 mg/dL (ref 8.9–10.3)
CO2: 25 mmol/L (ref 22–32)
Chloride: 106 mmol/L (ref 98–111)
Creatinine, Ser: 0.62 mg/dL (ref 0.44–1.00)
GFR calc non Af Amer: >60 mL/min (ref >60)
Glucose, Bld: 92 mg/dL (ref 70–99)
POTASSIUM: 4.7 mmol/L (ref 3.5–5.1)
SODIUM: 140 mmol/L (ref 135–145)
Total Bilirubin: 0.8 mg/dL (ref 0.3–1.2)
Total Protein: 7.5 g/dL (ref 6.5–8.1)

## 2018-02-11 MED ORDER — IOPAMIDOL (ISOVUE-300) INJECTION 61%
100.0000 mL | Freq: Once | INTRAVENOUS | Status: AC | PRN
  Administered 2018-02-11: 100 mL via INTRAVENOUS

## 2018-03-16 ENCOUNTER — Ambulatory Visit: Payer: Self-pay | Admitting: Urology

## 2018-03-21 ENCOUNTER — Ambulatory Visit: Payer: Medicare Other | Admitting: Anesthesiology

## 2018-03-21 ENCOUNTER — Ambulatory Visit
Admission: RE | Admit: 2018-03-21 | Discharge: 2018-03-21 | Disposition: A | Discharge status: Home / Self Care | Payer: Medicare Other | Source: Ambulatory Visit | Attending: Unknown Physician Specialty | Admitting: Unknown Physician Specialty

## 2018-03-21 ENCOUNTER — Encounter
Admission: RE | Disposition: A | Discharge status: Home / Self Care | Payer: Self-pay | Source: Ambulatory Visit | Attending: Unknown Physician Specialty

## 2018-03-21 ENCOUNTER — Encounter: Payer: Self-pay | Admitting: Anesthesiology

## 2018-03-21 DIAGNOSIS — K449 Diaphragmatic hernia without obstruction or gangrene: Secondary | ICD-10-CM | POA: Diagnosis not present

## 2018-03-21 DIAGNOSIS — Z79899 Other long term (current) drug therapy: Secondary | ICD-10-CM | POA: Diagnosis not present

## 2018-03-21 DIAGNOSIS — K222 Esophageal obstruction: Secondary | ICD-10-CM | POA: Insufficient documentation

## 2018-03-21 DIAGNOSIS — K219 Gastro-esophageal reflux disease without esophagitis: Secondary | ICD-10-CM | POA: Insufficient documentation

## 2018-03-21 DIAGNOSIS — E039 Hypothyroidism, unspecified: Secondary | ICD-10-CM | POA: Insufficient documentation

## 2018-03-21 DIAGNOSIS — I10 Essential (primary) hypertension: Secondary | ICD-10-CM | POA: Diagnosis not present

## 2018-03-21 HISTORY — DX: Dysphagia, unspecified: R13.10

## 2018-03-21 HISTORY — DX: Anemia, unspecified: D64.9

## 2018-03-21 HISTORY — PX: ESOPHAGOGASTRODUODENOSCOPY (EGD) WITH PROPOFOL: SHX5813

## 2018-03-21 HISTORY — DX: Other specified postprocedural states: R11.2

## 2018-03-21 HISTORY — DX: Unspecified osteoarthritis, unspecified site: M19.90

## 2018-03-21 HISTORY — DX: Essential (primary) hypertension: I10

## 2018-03-21 HISTORY — DX: Hypothyroidism, unspecified: E03.9

## 2018-03-21 HISTORY — DX: Other specified postprocedural states: Z98.890

## 2018-03-21 SURGERY — ESOPHAGOGASTRODUODENOSCOPY (EGD) WITH PROPOFOL
Anesthesia: General

## 2018-03-21 MED ORDER — PROPOFOL 500 MG/50ML IV EMUL
INTRAVENOUS | Status: AC
Start: 2018-03-21 — End: 2018-03-21
  Filled 2018-03-21: qty 50

## 2018-03-21 MED ORDER — BUTAMBEN-TETRACAINE-BENZOCAINE 2-2-14 % EX AERO
INHALATION_SPRAY | CUTANEOUS | Status: AC
  Filled 2018-03-21: qty 5

## 2018-03-21 MED ORDER — PROPOFOL 500 MG/50ML IV EMUL
INTRAVENOUS | Status: DC | PRN
  Administered 2018-03-21: 100 ug/kg/min via INTRAVENOUS

## 2018-03-21 MED ORDER — SODIUM CHLORIDE 0.9 % IV SOLN: INTRAVENOUS | Status: DC

## 2018-03-21 MED ORDER — SODIUM CHLORIDE 0.9 % IV SOLN
INTRAVENOUS | Status: DC
  Administered 2018-03-21: 1000 mL via INTRAVENOUS

## 2018-03-21 MED ORDER — FENTANYL CITRATE (PF) 100 MCG/2ML IJ SOLN
INTRAMUSCULAR | Status: DC | PRN
  Administered 2018-03-21: 50 ug via INTRAVENOUS

## 2018-03-21 MED ORDER — FENTANYL CITRATE (PF) 100 MCG/2ML IJ SOLN
INTRAMUSCULAR | Status: AC
  Filled 2018-03-21: qty 2

## 2018-03-21 NOTE — Anesthesia Preprocedure Evaluation (Signed)
Anesthesia Evaluation  Patient identified by MRN, date of birth, ID band Patient awake    Reviewed: Allergy & Precautions, H&P , NPO status , Patient's Chart, lab work & pertinent test results  History of Anesthesia Complications (+) PONV and history of anesthetic complications  Airway Mallampati: III  TM Distance: <3 FB Neck ROM: full    Dental  (+) Chipped   Pulmonary neg pulmonary ROS, neg shortness of breath,           Cardiovascular Exercise Tolerance: Good hypertension,      Neuro/Psych negative neurological ROS  negative psych ROS   GI/Hepatic Neg liver ROS, GERD  Medicated and Controlled,  Endo/Other  Hypothyroidism   Renal/GU negative Renal ROS  negative genitourinary   Musculoskeletal   Abdominal   Peds  Hematology negative hematology ROS (+)   Anesthesia Other Findings Past Medical History: No date: Anemia     Comment:  resolved No date: Arthritis No date: Cholelithiasis No date: Complication of anesthesia     Comment:  hard to wake up from pancreas blockage surgery at [facility]               In January, had to stay extra days in ICU. No date: Diverticulosis 02/08/2014: Dysphagia No date: Fibrocystic disease of breast No date: GERD (gastroesophageal reflux disease) No date: Heart murmur No date: Hypertension No date: Hypothyroidism No date: Pancreatitis No date: PONV (postoperative nausea and vomiting)  Past Surgical History: No date: ABDOMINAL HYSTERECTOMY No date: BREAST CYST ASPIRATION; Left No date: CHOLECYSTECTOMY 02/26/2003, 12/07/2008, 02/26/2014: COLONOSCOPY     Comment:  x 3, FH colon polyps 05/01/2015: ERCP 02/26/2014: ESOPHAGOGASTRODUODENOSCOPY 09/20/2015: FINGER ARTHROPLASTY; Right     Comment:  Procedure: FINGER ARTHROPLASTY RIGHT THUMB [place];                Surgeon: Leanor Kail, MD;  Location: [place];  Service: Orthopedics;  Laterality: Right; 2001:  KNEE ARTHROSCOPY; Left No date: MENISCUS REPAIR; Right No date: PANCREAS SURGERY     Comment:  blockage surgery No date: TONSILLECTOMY  BMI    Body Mass Index:  29.29 kg/m      Reproductive/Obstetrics negative OB ROS                             Anesthesia Physical Anesthesia Plan  ASA: III  Anesthesia Plan: General   Post-op Pain Management:    Induction: Intravenous  PONV Risk Score and Plan: Propofol infusion and TIVA  Airway Management Planned: Natural Airway and Nasal Cannula  Additional Equipment:   Intra-op Plan:   Post-operative Plan:   Informed Consent: I have reviewed the patients History and Physical, chart, labs and discussed the procedure including the risks, benefits and alternatives for the proposed anesthesia with the patient or authorized representative who has indicated his/her understanding and acceptance.   Dental Advisory Given  Plan Discussed with: Anesthesiologist, CRNA and Surgeon  Anesthesia Plan Comments: (Patient consented for risks of anesthesia including but not limited to:  - adverse reactions to medications - risk of intubation if required - damage to teeth, lips or other oral mucosa - sore throat or hoarseness - Damage to heart, brain, lungs or loss of life  Patient voiced understanding.)        Anesthesia Quick Evaluation

## 2018-03-21 NOTE — Anesthesia Post-op Follow-up Note (Signed)
Anesthesia QCDR form completed.        

## 2018-03-21 NOTE — H&P (Signed)
Primary Care Physician:  Kirk Ruths, MD Primary Gastroenterologist:  Dr. Vira Agar  Pre-Procedure History & Physical: HPI:  Felicia Daniels is a 77 y.o. female is here for an endoscopy.  Having problems with swallowing food.  Was dilated 5 years ago.   Past Medical History:  Diagnosis Date  . Anemia    resolved  . Arthritis   . Cholelithiasis   . Complication of anesthesia    hard to wake up from pancreas blockage surgery at [facility] In January, had to stay extra days in ICU.  Marland Kitchen Diverticulosis   . Dysphagia 02/08/2014  . Fibrocystic disease of breast   . GERD (gastroesophageal reflux disease)   . Heart murmur   . Hypertension   . Hypothyroidism   . Pancreatitis   . PONV (postoperative nausea and vomiting)     Past Surgical History:  Procedure Laterality Date  . ABDOMINAL HYSTERECTOMY    . BREAST CYST ASPIRATION Left   . CHOLECYSTECTOMY    . COLONOSCOPY  02/26/2003, 12/07/2008, 02/26/2014   x 3, FH colon polyps  . ERCP  05/01/2015  . ESOPHAGOGASTRODUODENOSCOPY  02/26/2014  . FINGER ARTHROPLASTY Right 09/20/2015   Procedure: FINGER ARTHROPLASTY RIGHT THUMB [place];  Surgeon: Leanor Kail, MD;  Location: [place];  Service: Orthopedics;  Laterality: Right;  . KNEE ARTHROSCOPY Left 2001  . MENISCUS REPAIR Right   . PANCREAS SURGERY     blockage surgery  . TONSILLECTOMY      Prior to Admission medications   Medication Sig Start Date End Date Taking? Authorizing Provider  acetaminophen (TYLENOL) 500 MG tablet Take 500 mg by mouth every 6 (six) hours as needed.    [provider]  Cholecalciferol (VITAMIN D3) 1000 UNITS CAPS Take 1 tablet by mouth daily.    [provider]  Ferrous Fumarate (HEMOCYTE - 106 MG FE) 324 (106 Fe) MG TABS tablet Take 1 tablet by mouth.    [provider]  ibuprofen (ADVIL,MOTRIN) 200 MG tablet Take 200 mg by mouth every 6 (six) hours as needed.    [provider]  Multiple Vitamin (MULTI-VITAMINS)  TABS Take 1 tablet by mouth daily.    [provider]  omeprazole (PRILOSEC) 20 MG capsule Take 1 capsule by mouth daily. 01/07/15   [provider]  senna-docusate (SENOKOT-S) 8.6-50 MG tablet Take 1 tablet by mouth daily.    [provider]  ursodiol (ACTIGALL) 300 MG capsule Take 1 capsule by mouth 2 (two) times daily. Reported on 09/11/2015 10/10/14   [provider]    Allergies as of 03/15/2018 - Review Complete 02/11/2018  Allergen Reaction Noted  . Synthroid [levothyroxine sodium] Shortness Of Breath and Other (See Comments) 02/09/2015  . Ampicillin Itching 09/04/2016  . Keflex [cephalexin] Rash 01/28/2015  . Penicillins Rash 01/28/2015  . Tape Rash 09/11/2015    Family History  Problem Relation Age of Onset  . Skin cancer Mother   . Breast cancer Daughter 23    Social History   Socioeconomic History  . Marital status: Divorced    Spouse name: Not on file  . Number of children: Not on file  . Years of education: Not on file  . Highest education level: Not on file  Occupational History  . Not on file  Social Needs  . Financial resource strain: Not on file  . Food insecurity:    Worry: Not on file    Inability: Not on file  . Transportation needs:  Medical: Not on file    Non-medical: Not on file  Tobacco Use  . Smoking status: Never Smoker  . Smokeless tobacco: Never Used  Substance and Sexual Activity  . Alcohol use: No    Alcohol/week: 0.0 standard drinks  . Drug use: No  . Sexual activity: Not on file  Lifestyle  . Physical activity:    Days per week: Not on file    Minutes per session: Not on file  . Stress: Not on file  Relationships  . Social connections:    Talks on phone: Not on file    Gets together: Not on file    Attends religious service: Not on file    Active member of club or organization: Not on file    Attends meetings of clubs or organizations: Not on file    Relationship status: Not on file  .  Intimate partner violence:    Fear of current or ex partner: Not on file    Emotionally abused: Not on file    Physically abused: Not on file    Forced sexual activity: Not on file  Other Topics Concern  . Not on file  Social History Narrative  . Not on file    Review of Systems: See HPI, otherwise negative ROS  Physical Exam: BP (!) 168/56   Pulse 63   Temp (!) 97.5 F (36.4 C) (Tympanic)   Resp 20   Ht 5\' 1"  (1.549 m)   Wt 70.3 kg   SpO2 100%   BMI 29.29 kg/m  General:   Alert,  pleasant and cooperative in NAD Head:  Normocephalic and atraumatic. Neck:  Supple; no masses or thyromegaly. Lungs:  Clear throughout to auscultation.    Heart:  Regular rate and rhythm. Abdomen:  Soft, nontender and nondistended. Normal bowel sounds, without guarding, and without rebound.   Neurologic:  Alert and  oriented x4;  grossly normal neurologically.  Impression/Plan: Felicia Daniels is here for an endoscopy to be performed for dysphagia to solid foods.  Risks, benefits, limitations, and alternatives regarding  endoscopy have been reviewed with the patient.  Questions have been answered.  All parties agreeable.   Gaylyn Cheers, MD  03/21/2018, 9:40 AM

## 2018-03-21 NOTE — Anesthesia Procedure Notes (Signed)
Performed by: Cook-Martin, Haely Leyland Pre-anesthesia Checklist: Patient identified, Emergency Drugs available, Suction available, Patient being monitored and Timeout performed Patient Re-evaluated:Patient Re-evaluated prior to induction Oxygen Delivery Method: Nasal cannula Preoxygenation: Pre-oxygenation with 100% oxygen Induction Type: IV induction Airway Equipment and Method: Bite block Placement Confirmation: CO2 detector and positive ETCO2       

## 2018-03-21 NOTE — Anesthesia Postprocedure Evaluation (Signed)
Anesthesia Post Note  Patient: Felicia Daniels  Procedure(s) Performed: ESOPHAGOGASTRODUODENOSCOPY (EGD) WITH PROPOFOL (N/A )  Patient location during evaluation: Endoscopy Anesthesia Type: General Level of consciousness: awake and alert Pain management: pain level controlled Vital Signs Assessment: post-procedure vital signs reviewed and stable Respiratory status: spontaneous breathing, nonlabored ventilation, respiratory function stable and patient connected to nasal cannula oxygen Cardiovascular status: blood pressure returned to baseline and stable Postop Assessment: no apparent nausea or vomiting Anesthetic complications: no     Last Vitals:  Vitals:   03/21/18 1018 03/21/18 1028  BP: (!) 121/96 (!) 109/55  Pulse: (!) 58 (!) 57  Resp: 19 18  Temp:    SpO2: 98% 97%    Last Pain:  Vitals:   03/21/18 1028  TempSrc:   PainSc: 0-No pain                 Precious Haws Piscitello

## 2018-03-21 NOTE — Op Note (Signed)
[facility] Gastroenterology Patient Name: Felicia Daniels Procedure Date: 03/21/2018 9:18 AM MRN: [id number] Account #: [id number] Date of Birth: 06-19-1940 Admit Type: Outpatient Age: 77 Room: [facility] ENDO ROOM 3 Gender: Female Note Status: Finalized Procedure:            Upper GI endoscopy Indications:          Dysphagia Providers:            Manya Silvas, MD Referring MD:         Ocie Cornfield. Ouida Sills MD, MD (Referring MD) Medicines:            Propofol per Anesthesia Complications:        No immediate complications. Procedure:            Pre-Anesthesia Assessment:                       - After reviewing the risks and benefits, the patient                        was deemed in satisfactory condition to undergo the                        procedure.                       After obtaining informed consent, the endoscope was                        passed under direct vision. Throughout the procedure,                        the patient's blood pressure, pulse, and oxygen                        saturations were monitored continuously. The Endoscope                        was introduced through the mouth, and advanced to the                        second part of duodenum. The upper GI endoscopy was                        accomplished without difficulty. The patient tolerated                        the procedure well. Findings:      A mild Schatzki ring was found at the gastroesophageal junction. GEJ 36cm      A small hiatal hernia was present. Stomach otherwise normal.      The examined duodenum was normal. Impression:           - Mild Schatzki ring.                       - Small hiatal hernia.                       - Normal examined duodenum.                       - No specimens collected. Recommendation:       -  soft food for 3 days, eat slowly, chew well, take                        small bites Manya Silvas, MD 03/21/2018 9:55:56 AM This report has been  signed electronically. Number of Addenda: 0 Note Initiated On: 03/21/2018 9:18 AM      [facility]

## 2018-03-21 NOTE — Transfer of Care (Signed)
Immediate Anesthesia Transfer of Care Note  Patient: Felicia Daniels  Procedure(s) Performed: ESOPHAGOGASTRODUODENOSCOPY (EGD) WITH PROPOFOL (N/A )  Patient Location: PACU  Anesthesia Type:General  Level of Consciousness: awake and sedated  Airway & Oxygen Therapy: Patient Spontanous Breathing and Patient connected to nasal cannula oxygen  Post-op Assessment: Report given to RN and Post -op Vital signs reviewed and stable  Post vital signs: Reviewed and stable  Last Vitals:  Vitals Value Taken Time  BP    Temp    Pulse    Resp    SpO2      Last Pain:  Vitals:   03/21/18 0917  TempSrc: Tympanic  PainSc: 0-No pain         Complications: No apparent anesthesia complications

## 2018-03-22 ENCOUNTER — Encounter: Payer: Self-pay | Admitting: Unknown Physician Specialty

## 2018-03-31 ENCOUNTER — Other Ambulatory Visit: Payer: Self-pay | Admitting: Internal Medicine

## 2018-03-31 DIAGNOSIS — Z1231 Encounter for screening mammogram for malignant neoplasm of breast: Secondary | ICD-10-CM

## 2018-04-20 ENCOUNTER — Ambulatory Visit
Admission: RE | Admit: 2018-04-20 | Discharge: 2018-04-20 | Disposition: A | Discharge status: Home / Self Care | Payer: Medicare Other | Source: Ambulatory Visit | Attending: Internal Medicine | Admitting: Internal Medicine

## 2018-04-20 DIAGNOSIS — Z1231 Encounter for screening mammogram for malignant neoplasm of breast: Secondary | ICD-10-CM | POA: Insufficient documentation

## 2018-07-07 ENCOUNTER — Other Ambulatory Visit: Payer: Self-pay | Admitting: Nurse Practitioner

## 2018-07-07 DIAGNOSIS — K743 Primary biliary cirrhosis: Secondary | ICD-10-CM

## 2019-01-20 ENCOUNTER — Other Ambulatory Visit: Admission: RE | Admit: 2019-01-20 | Payer: Medicare HMO | Source: Ambulatory Visit

## 2019-01-23 ENCOUNTER — Inpatient Hospital Stay: Admission: RE | Admit: 2019-01-23 | Payer: Medicare Other | Source: Ambulatory Visit

## 2019-01-26 ENCOUNTER — Inpatient Hospital Stay: Admit: 2019-01-26 | Payer: Medicare HMO | Admitting: Surgery

## 2019-01-26 SURGERY — ARTHROPLASTY, KNEE, TOTAL
Anesthesia: Choice | Site: Knee | Laterality: Left

## 2019-02-01 ENCOUNTER — Other Ambulatory Visit: Payer: Self-pay | Admitting: Unknown Physician Specialty

## 2019-02-01 DIAGNOSIS — H9120 Sudden idiopathic hearing loss, unspecified ear: Secondary | ICD-10-CM

## 2019-02-14 ENCOUNTER — Other Ambulatory Visit: Payer: Self-pay

## 2019-02-14 ENCOUNTER — Ambulatory Visit
Admission: RE | Admit: 2019-02-14 | Discharge: 2019-02-14 | Disposition: A | Discharge status: Home / Self Care | Payer: Medicare HMO | Source: Ambulatory Visit | Attending: Unknown Physician Specialty | Admitting: Unknown Physician Specialty

## 2019-02-14 DIAGNOSIS — H9120 Sudden idiopathic hearing loss, unspecified ear: Secondary | ICD-10-CM | POA: Diagnosis present

## 2019-02-14 LAB — POCT I-STAT CREATININE: Creatinine, Ser: 0.8 mg/dL (ref 0.44–1.00)

## 2019-02-14 MED ORDER — GADOBUTROL 1 MMOL/ML IV SOLN
7.0000 mL | Freq: Once | INTRAVENOUS | Status: AC | PRN
  Administered 2019-02-14: 16:00:00 7 mL via INTRAVENOUS

## 2019-03-20 ENCOUNTER — Other Ambulatory Visit: Payer: Self-pay

## 2019-03-20 ENCOUNTER — Encounter
Admission: RE | Admit: 2019-03-20 | Discharge: 2019-03-20 | Disposition: A | Discharge status: Home / Self Care | Payer: Medicare HMO | Source: Ambulatory Visit | Attending: Surgery | Admitting: Surgery

## 2019-03-20 DIAGNOSIS — Z01810 Encounter for preprocedural cardiovascular examination: Secondary | ICD-10-CM | POA: Diagnosis present

## 2019-03-20 DIAGNOSIS — R9431 Abnormal electrocardiogram [ECG] [EKG]: Secondary | ICD-10-CM | POA: Insufficient documentation

## 2019-03-20 DIAGNOSIS — Z01812 Encounter for preprocedural laboratory examination: Secondary | ICD-10-CM | POA: Diagnosis present

## 2019-03-20 LAB — SURGICAL PCR SCREEN
MRSA, PCR: NEGATIVE
Staphylococcus aureus: NEGATIVE

## 2019-03-20 LAB — CBC
HCT: 40.2 % (ref 36.0–46.0)
Hemoglobin: 13 g/dL (ref 12.0–15.0)
MCH: 30.4 pg (ref 26.0–34.0)
MCHC: 32.3 g/dL (ref 30.0–36.0)
MCV: 93.9 fL (ref 80.0–100.0)
Platelets: 272 10*3/uL (ref 150–400)
RBC: 4.28 MIL/uL (ref 3.87–5.11)
RDW: 13.6 % (ref 11.5–15.5)
WBC: 10.1 10*3/uL (ref 4.0–10.5)
nRBC: 0 % (ref 0.0–0.2)

## 2019-03-20 LAB — URINALYSIS, ROUTINE W REFLEX MICROSCOPIC
Bilirubin Urine: NEGATIVE
Glucose, UA: NEGATIVE mg/dL
Hgb urine dipstick: NEGATIVE
Ketones, ur: NEGATIVE mg/dL
Leukocytes,Ua: NEGATIVE
Nitrite: NEGATIVE
Protein, ur: NEGATIVE mg/dL
Specific Gravity, Urine: 1.013 (ref 1.005–1.030)
pH: 6 (ref 5.0–8.0)

## 2019-03-20 LAB — BASIC METABOLIC PANEL
Anion gap: 9 (ref 5–15)
BUN: 21 mg/dL (ref 8–23)
CO2: 26 mmol/L (ref 22–32)
Calcium: 9.3 mg/dL (ref 8.9–10.3)
Chloride: 105 mmol/L (ref 98–111)
Creatinine, Ser: 0.61 mg/dL (ref 0.44–1.00)
GFR calc Af Amer: >60 mL/min (ref >60)
GFR calc non Af Amer: >60 mL/min (ref >60)
Glucose, Bld: 77 mg/dL (ref 70–99)
Potassium: 3.9 mmol/L (ref 3.5–5.1)
Sodium: 140 mmol/L (ref 135–145)

## 2019-03-20 LAB — TYPE AND SCREEN
ABO/RH(D): A POS
Antibody Screen: NEGATIVE

## 2019-03-20 NOTE — Patient Instructions (Signed)
Your procedure is scheduled on: Tuesday 03/28/19.  Report to DAY SURGERY DEPARTMENT LOCATED ON 2ND FLOOR MEDICAL MALL ENTRANCE. To find out your arrival time please call [phone] between 1PM - 3PM on Monday 03/27/19.    Remember: Instructions that are not followed completely may result in serious medical risk, up to and including death, or upon the discretion of your surgeon and anesthesiologist your surgery may need to be rescheduled.       _X__ 1. Do not eat food after midnight the night before your procedure.                 No gum chewing or hard candies. You may drink clear liquids up to 2 hours                 before you are scheduled to arrive for your surgery- DO NOT drink clear                 liquids within 2 hours of the start of your surgery.                 Clear Liquids include:  water, apple juice without pulp, clear carbohydrate                 drink such as Clearfast or Gatorade, Black Coffee or Tea (Do not add                 milk and creamer to coffee or tea).    __X__2.  On the morning of surgery brush your teeth with toothpaste and water, you may rinse your mouth with mouthwash if you wish.  Do not swallow any toothpaste or mouthwash.    __X__3.  Notify your doctor if there is any change in your medical condition      (cold, fever, infections).       Do not wear jewelry, make-up, hairpins, clips or nail polish. Do not wear lotions, powders, or perfumes.  Do not shave 48 hours prior to surgery. Men may shave face and neck. Do not bring valuables to the hospital.      [facility] is not responsible for any belongings or valuables.    Contacts, dentures/partials or body piercings may not be worn into surgery. Bring a case for your contacts, glasses or hearing aids, a denture cup will be supplied.      Please read over the following fact sheets that you were given:   MRSA Information   __X__ Take these medicines the morning of surgery with A SIP  OF WATER:     1. omeprazole (PRILOSEC) 20 MG capsule  2. ursodiol (ACTIGALL) 500 MG tablet    __X__ Use CHG Soap as directed    __X__ Stop Blood Thinners: Aspirin. Your last dose is today.    __X__ Stop Anti-inflammatories 7 days before surgery such as Advil, Ibuprofen, Motrin, BC or Goodies Powder, Naprosyn, Naproxen, Aleve, Aspirin, Meloxicam. May take Tylenol if needed for pain or discomfort.     __X__ Don't start taking any new herbal supplements before your procedure.

## 2019-03-21 LAB — URINE CULTURE: Culture: <10000 — AB

## 2019-03-24 ENCOUNTER — Other Ambulatory Visit: Payer: Self-pay

## 2019-03-24 ENCOUNTER — Other Ambulatory Visit
Admission: RE | Admit: 2019-03-24 | Discharge: 2019-03-24 | Disposition: A | Discharge status: Home / Self Care | Payer: Medicare HMO | Source: Ambulatory Visit | Attending: Surgery | Admitting: Surgery

## 2019-03-24 DIAGNOSIS — Z20828 Contact with and (suspected) exposure to other viral communicable diseases: Secondary | ICD-10-CM | POA: Insufficient documentation

## 2019-03-24 DIAGNOSIS — Z01812 Encounter for preprocedural laboratory examination: Secondary | ICD-10-CM | POA: Insufficient documentation

## 2019-03-24 LAB — SARS CORONAVIRUS 2 (TAT 6-24 HRS): SARS Coronavirus 2: NEGATIVE

## 2019-03-27 MED ORDER — CLINDAMYCIN PHOSPHATE 900 MG/50ML IV SOLN
900.0000 mg | Freq: Once | INTRAVENOUS | Status: AC
  Administered 2019-03-28: 900 mg via INTRAVENOUS

## 2019-03-28 ENCOUNTER — Inpatient Hospital Stay: Payer: Medicare HMO | Admitting: Anesthesiology

## 2019-03-28 ENCOUNTER — Other Ambulatory Visit: Payer: Self-pay

## 2019-03-28 ENCOUNTER — Inpatient Hospital Stay: Payer: Medicare HMO

## 2019-03-28 ENCOUNTER — Encounter
Admission: RE | Disposition: A | Discharge status: Home Health | Payer: Self-pay | Source: Ambulatory Visit | Attending: Surgery

## 2019-03-28 ENCOUNTER — Inpatient Hospital Stay
Admission: RE | Admit: 2019-03-28 | Discharge: 2019-03-31 | DRG: 470 | Disposition: A | Discharge status: Home Health | Payer: Medicare HMO | Source: Ambulatory Visit | Attending: Surgery | Admitting: Surgery

## 2019-03-28 DIAGNOSIS — Z881 Allergy status to other antibiotic agents status: Secondary | ICD-10-CM | POA: Diagnosis not present

## 2019-03-28 DIAGNOSIS — Z96652 Presence of left artificial knee joint: Secondary | ICD-10-CM

## 2019-03-28 DIAGNOSIS — Z880 Allergy status to penicillin: Secondary | ICD-10-CM | POA: Diagnosis not present

## 2019-03-28 DIAGNOSIS — E039 Hypothyroidism, unspecified: Secondary | ICD-10-CM | POA: Diagnosis present

## 2019-03-28 DIAGNOSIS — K219 Gastro-esophageal reflux disease without esophagitis: Secondary | ICD-10-CM | POA: Diagnosis present

## 2019-03-28 DIAGNOSIS — Z86010 Personal history of colonic polyps: Secondary | ICD-10-CM | POA: Diagnosis not present

## 2019-03-28 DIAGNOSIS — I10 Essential (primary) hypertension: Secondary | ICD-10-CM | POA: Diagnosis present

## 2019-03-28 DIAGNOSIS — E669 Obesity, unspecified: Secondary | ICD-10-CM | POA: Diagnosis present

## 2019-03-28 DIAGNOSIS — Z6830 Body mass index (BMI) 30.0-30.9, adult: Secondary | ICD-10-CM

## 2019-03-28 DIAGNOSIS — Z888 Allergy status to other drugs, medicaments and biological substances status: Secondary | ICD-10-CM | POA: Diagnosis not present

## 2019-03-28 DIAGNOSIS — Z79899 Other long term (current) drug therapy: Secondary | ICD-10-CM | POA: Diagnosis not present

## 2019-03-28 DIAGNOSIS — M1712 Unilateral primary osteoarthritis, left knee: Principal | ICD-10-CM | POA: Diagnosis present

## 2019-03-28 DIAGNOSIS — M654 Radial styloid tenosynovitis [de Quervain]: Secondary | ICD-10-CM | POA: Diagnosis present

## 2019-03-28 DIAGNOSIS — Z8249 Family history of ischemic heart disease and other diseases of the circulatory system: Secondary | ICD-10-CM | POA: Diagnosis not present

## 2019-03-28 DIAGNOSIS — Z91048 Other nonmedicinal substance allergy status: Secondary | ICD-10-CM | POA: Diagnosis not present

## 2019-03-28 DIAGNOSIS — Z7982 Long term (current) use of aspirin: Secondary | ICD-10-CM

## 2019-03-28 DIAGNOSIS — M25562 Pain in left knee: Secondary | ICD-10-CM | POA: Diagnosis present

## 2019-03-28 HISTORY — PX: TOTAL KNEE ARTHROPLASTY: SHX125

## 2019-03-28 LAB — ABO/RH: ABO/RH(D): A POS

## 2019-03-28 SURGERY — ARTHROPLASTY, KNEE, TOTAL
Anesthesia: Spinal | Site: Knee | Laterality: Left

## 2019-03-28 MED ORDER — LIDOCAINE HCL (PF) 2 % IJ SOLN
INTRAMUSCULAR | Status: AC
  Filled 2019-03-28: qty 10

## 2019-03-28 MED ORDER — CHLORHEXIDINE GLUCONATE CLOTH 2 % EX PADS
6.0000 | MEDICATED_PAD | Freq: Every day | CUTANEOUS | Status: DC
  Administered 2019-03-29: 6 via TOPICAL

## 2019-03-28 MED ORDER — PROPOFOL 500 MG/50ML IV EMUL
INTRAVENOUS | Status: DC | PRN
  Administered 2019-03-28: 40 ug/kg/min via INTRAVENOUS

## 2019-03-28 MED ORDER — METOCLOPRAMIDE HCL 5 MG/ML IJ SOLN
5.0000 mg | Freq: Three times a day (TID) | INTRAMUSCULAR | Status: DC | PRN
  Administered 2019-03-28: 10 mg via INTRAVENOUS
  Filled 2019-03-28: qty 2

## 2019-03-28 MED ORDER — CLINDAMYCIN PHOSPHATE 900 MG/50ML IV SOLN
INTRAVENOUS | Status: AC
  Filled 2019-03-28: qty 50

## 2019-03-28 MED ORDER — ACETAMINOPHEN 500 MG PO TABS
1000.0000 mg | ORAL_TABLET | Freq: Four times a day (QID) | ORAL | Status: AC
  Administered 2019-03-28 – 2019-03-29 (×4): 1000 mg via ORAL
  Filled 2019-03-28 (×4): qty 2

## 2019-03-28 MED ORDER — BUPIVACAINE HCL (PF) 0.5 % IJ SOLN
INTRAMUSCULAR | Status: DC | PRN
  Administered 2019-03-28: 3 mL

## 2019-03-28 MED ORDER — CLINDAMYCIN PHOSPHATE 600 MG/50ML IV SOLN
600.0000 mg | Freq: Four times a day (QID) | INTRAVENOUS | Status: AC
  Administered 2019-03-28 – 2019-03-29 (×3): 600 mg via INTRAVENOUS
  Filled 2019-03-28 (×3): qty 50

## 2019-03-28 MED ORDER — KETOROLAC TROMETHAMINE 15 MG/ML IJ SOLN
15.0000 mg | Freq: Once | INTRAMUSCULAR | Status: AC
  Administered 2019-03-28: 13:00:00 15 mg via INTRAVENOUS

## 2019-03-28 MED ORDER — PROPOFOL 10 MG/ML IV BOLUS
INTRAVENOUS | Status: DC | PRN
  Administered 2019-03-28 (×4): 14 mg via INTRAVENOUS

## 2019-03-28 MED ORDER — OXYCODONE HCL 5 MG PO TABS
5.0000 mg | ORAL_TABLET | ORAL | Status: DC | PRN
  Administered 2019-03-28 – 2019-03-29 (×3): 5 mg via ORAL
  Administered 2019-03-29: 10 mg via ORAL
  Administered 2019-03-29 – 2019-03-30 (×2): 5 mg via ORAL
  Administered 2019-03-30 – 2019-03-31 (×4): 10 mg via ORAL
  Administered 2019-03-31 (×2): 5 mg via ORAL
  Filled 2019-03-28 (×2): qty 1
  Filled 2019-03-28 (×3): qty 2
  Filled 2019-03-28 (×5): qty 1
  Filled 2019-03-28 (×2): qty 2

## 2019-03-28 MED ORDER — SODIUM CHLORIDE 0.9 % IV SOLN
INTRAVENOUS | Status: DC
  Administered 2019-03-28: 15:00:00 via INTRAVENOUS

## 2019-03-28 MED ORDER — ENOXAPARIN SODIUM 40 MG/0.4ML ~~LOC~~ SOLN
40.0000 mg | SUBCUTANEOUS | Status: DC
  Administered 2019-03-29 – 2019-03-31 (×3): 40 mg via SUBCUTANEOUS
  Filled 2019-03-28 (×3): qty 0.4

## 2019-03-28 MED ORDER — FERROUS FUMARATE 324 (106 FE) MG PO TABS
106.0000 mg | ORAL_TABLET | Freq: Every day | ORAL | Status: DC
  Administered 2019-03-28 – 2019-03-31 (×4): 106 mg via ORAL
  Filled 2019-03-28 (×5): qty 1

## 2019-03-28 MED ORDER — HYDROMORPHONE HCL 1 MG/ML IJ SOLN: 0.2500 mg | INTRAMUSCULAR | Status: DC | PRN

## 2019-03-28 MED ORDER — ONDANSETRON HCL 4 MG/2ML IJ SOLN
4.0000 mg | Freq: Four times a day (QID) | INTRAMUSCULAR | Status: DC | PRN
  Administered 2019-03-28: 15:00:00 4 mg via INTRAVENOUS

## 2019-03-28 MED ORDER — SODIUM CHLORIDE 0.9 % IV SOLN
INTRAVENOUS | Status: DC | PRN
  Administered 2019-03-28: 30 ug/min via INTRAVENOUS

## 2019-03-28 MED ORDER — TRANEXAMIC ACID 1000 MG/10ML IV SOLN
INTRAVENOUS | Status: DC | PRN
  Administered 2019-03-28: 1000 mg via INTRAVENOUS

## 2019-03-28 MED ORDER — LACTATED RINGERS IV SOLN
INTRAVENOUS | Status: DC
  Administered 2019-03-28 (×2): via INTRAVENOUS

## 2019-03-28 MED ORDER — TRANEXAMIC ACID 1000 MG/10ML IV SOLN
INTRAVENOUS | Status: AC
  Filled 2019-03-28: qty 10

## 2019-03-28 MED ORDER — OXYCODONE HCL 5 MG/5ML PO SOLN: 5.0000 mg | Freq: Once | ORAL | Status: DC | PRN

## 2019-03-28 MED ORDER — BUPIVACAINE HCL (PF) 0.5 % IJ SOLN
INTRAMUSCULAR | Status: AC
  Filled 2019-03-28: qty 30

## 2019-03-28 MED ORDER — ACETAMINOPHEN 325 MG PO TABS: 325.0000 mg | ORAL_TABLET | Freq: Four times a day (QID) | ORAL | Status: DC | PRN

## 2019-03-28 MED ORDER — DIPHENHYDRAMINE HCL 12.5 MG/5ML PO ELIX: 12.5000 mg | ORAL_SOLUTION | ORAL | Status: DC | PRN

## 2019-03-28 MED ORDER — TRAMADOL HCL 50 MG PO TABS: 50.0000 mg | ORAL_TABLET | Freq: Four times a day (QID) | ORAL | Status: DC | PRN

## 2019-03-28 MED ORDER — RISEDRONATE SODIUM 5 MG PO TABS: 35.0000 mg | ORAL_TABLET | ORAL | Status: DC

## 2019-03-28 MED ORDER — MIDAZOLAM HCL 2 MG/2ML IJ SOLN
INTRAMUSCULAR | Status: AC
  Filled 2019-03-28: qty 2

## 2019-03-28 MED ORDER — EPINEPHRINE PF 1 MG/ML IJ SOLN
INTRAMUSCULAR | Status: AC
  Filled 2019-03-28: qty 1

## 2019-03-28 MED ORDER — FENTANYL CITRATE (PF) 100 MCG/2ML IJ SOLN
INTRAMUSCULAR | Status: DC | PRN
  Administered 2019-03-28: 50 ug via INTRAVENOUS
  Administered 2019-03-28: 25 ug via INTRAVENOUS

## 2019-03-28 MED ORDER — FENTANYL CITRATE (PF) 100 MCG/2ML IJ SOLN: 25.0000 ug | INTRAMUSCULAR | Status: DC | PRN

## 2019-03-28 MED ORDER — URSODIOL 300 MG PO CAPS
300.0000 mg | ORAL_CAPSULE | Freq: Two times a day (BID) | ORAL | Status: DC
  Administered 2019-03-28 – 2019-03-31 (×6): 300 mg via ORAL
  Filled 2019-03-28 (×8): qty 1

## 2019-03-28 MED ORDER — FENTANYL CITRATE (PF) 100 MCG/2ML IJ SOLN
INTRAMUSCULAR | Status: AC
  Filled 2019-03-28: qty 2

## 2019-03-28 MED ORDER — OXYCODONE HCL 5 MG PO TABS: 5.0000 mg | ORAL_TABLET | Freq: Once | ORAL | Status: DC | PRN

## 2019-03-28 MED ORDER — ADULT MULTIVITAMIN W/MINERALS CH
1.0000 | ORAL_TABLET | Freq: Every day | ORAL | Status: DC
  Administered 2019-03-29 – 2019-03-31 (×3): 1 via ORAL
  Filled 2019-03-28 (×3): qty 1

## 2019-03-28 MED ORDER — BUPIVACAINE LIPOSOME 1.3 % IJ SUSP
INTRAMUSCULAR | Status: AC
  Filled 2019-03-28: qty 20

## 2019-03-28 MED ORDER — PANTOPRAZOLE SODIUM 40 MG PO TBEC
40.0000 mg | DELAYED_RELEASE_TABLET | Freq: Every day | ORAL | Status: DC
  Administered 2019-03-29 – 2019-03-31 (×3): 40 mg via ORAL
  Filled 2019-03-28 (×3): qty 1

## 2019-03-28 MED ORDER — GLYCOPYRROLATE 0.2 MG/ML IJ SOLN
INTRAMUSCULAR | Status: AC
  Filled 2019-03-28: qty 1

## 2019-03-28 MED ORDER — VITAMIN D 25 MCG (1000 UNIT) PO TABS
2000.0000 [IU] | ORAL_TABLET | Freq: Every day | ORAL | Status: DC
  Administered 2019-03-29 – 2019-03-31 (×3): 2000 [IU] via ORAL
  Filled 2019-03-28 (×3): qty 2

## 2019-03-28 MED ORDER — FLEET ENEMA 7-19 GM/118ML RE ENEM: 1.0000 | ENEMA | Freq: Once | RECTAL | Status: DC | PRN

## 2019-03-28 MED ORDER — BUPIVACAINE-EPINEPHRINE (PF) 0.5% -1:200000 IJ SOLN
INTRAMUSCULAR | Status: DC | PRN
  Administered 2019-03-28: 30 mL via PERINEURAL

## 2019-03-28 MED ORDER — KETOROLAC TROMETHAMINE 15 MG/ML IJ SOLN
7.5000 mg | Freq: Four times a day (QID) | INTRAMUSCULAR | Status: AC
  Administered 2019-03-28 – 2019-03-29 (×4): 7.5 mg via INTRAVENOUS
  Filled 2019-03-28 (×4): qty 1

## 2019-03-28 MED ORDER — MIDAZOLAM HCL 5 MG/5ML IJ SOLN
INTRAMUSCULAR | Status: DC | PRN
  Administered 2019-03-28 (×2): 1 mg via INTRAVENOUS

## 2019-03-28 MED ORDER — ONDANSETRON HCL 4 MG PO TABS: 4.0000 mg | ORAL_TABLET | Freq: Four times a day (QID) | ORAL | Status: DC | PRN

## 2019-03-28 MED ORDER — DOCUSATE SODIUM 100 MG PO CAPS
100.0000 mg | ORAL_CAPSULE | Freq: Two times a day (BID) | ORAL | Status: DC
  Administered 2019-03-28 – 2019-03-31 (×6): 100 mg via ORAL
  Filled 2019-03-28 (×6): qty 1

## 2019-03-28 MED ORDER — MAGNESIUM HYDROXIDE 400 MG/5ML PO SUSP: 30.0000 mL | Freq: Every day | ORAL | Status: DC | PRN

## 2019-03-28 MED ORDER — SODIUM CHLORIDE FLUSH 0.9 % IV SOLN
INTRAVENOUS | Status: AC
  Filled 2019-03-28: qty 40

## 2019-03-28 MED ORDER — ONDANSETRON HCL 4 MG/2ML IJ SOLN
INTRAMUSCULAR | Status: AC
  Administered 2019-03-28: 4 mg via INTRAVENOUS
  Filled 2019-03-28: qty 2

## 2019-03-28 MED ORDER — METOCLOPRAMIDE HCL 10 MG PO TABS: 5.0000 mg | ORAL_TABLET | Freq: Three times a day (TID) | ORAL | Status: DC | PRN

## 2019-03-28 MED ORDER — PROPOFOL 500 MG/50ML IV EMUL
INTRAVENOUS | Status: AC
  Filled 2019-03-28: qty 50

## 2019-03-28 MED ORDER — KETOROLAC TROMETHAMINE 15 MG/ML IJ SOLN
INTRAMUSCULAR | Status: AC
  Administered 2019-03-28: 15 mg via INTRAVENOUS
  Filled 2019-03-28: qty 1

## 2019-03-28 MED ORDER — GLYCOPYRROLATE 0.2 MG/ML IJ SOLN
INTRAMUSCULAR | Status: DC | PRN
  Administered 2019-03-28: 0.2 mg via INTRAVENOUS

## 2019-03-28 MED ORDER — ASPIRIN EC 81 MG PO TBEC
81.0000 mg | DELAYED_RELEASE_TABLET | Freq: Every day | ORAL | Status: DC
  Administered 2019-03-29 – 2019-03-31 (×3): 81 mg via ORAL
  Filled 2019-03-28 (×3): qty 1

## 2019-03-28 MED ORDER — SODIUM CHLORIDE 0.9 % IV SOLN
INTRAVENOUS | Status: DC | PRN
  Administered 2019-03-28: 60 mL

## 2019-03-28 MED ORDER — BISACODYL 10 MG RE SUPP
10.0000 mg | Freq: Every day | RECTAL | Status: DC | PRN
  Administered 2019-03-29: 10 mg via RECTAL
  Filled 2019-03-28: qty 1

## 2019-03-28 SURGICAL SUPPLY — 64 items
BEARING TIBIAL VG AS 71X12 (Joint) IMPLANT
BIT DRILL QUICK REL 1/8 2PK SL (DRILL) IMPLANT
BLADE SAW SAG 25X90X1.19 (BLADE) ×3 IMPLANT
BLADE SURG SZ20 CARB STEEL (BLADE) ×3 IMPLANT
BNDG ELASTIC 6X5.8 VLCR NS LF (GAUZE/BANDAGES/DRESSINGS) ×3 IMPLANT
BNDG ELASTIC 6X5.8 VLCR STR LF (GAUZE/BANDAGES/DRESSINGS) ×2 IMPLANT
CANISTER SUCT 1200ML W/VALVE (MISCELLANEOUS) ×3 IMPLANT
CANISTER SUCT 3000ML PPV (MISCELLANEOUS) ×3 IMPLANT
CEMENT BONE R 1X40 (Cement) ×6 IMPLANT
CEMENT VACUUM MIXING SYSTEM (MISCELLANEOUS) ×3 IMPLANT
CHLORAPREP W/TINT 26 (MISCELLANEOUS) ×3 IMPLANT
COOLER POLAR GLACIER W/PUMP (MISCELLANEOUS) ×3 IMPLANT
COVER MAYO STAND REUSABLE (DRAPES) ×3 IMPLANT
COVER WAND RF STERILE (DRAPES) ×3 IMPLANT
CUFF TOURN SGL QUICK 24 (TOURNIQUET CUFF) ×2
CUFF TRNQT CYL 24X4X16.5-23 (TOURNIQUET CUFF) IMPLANT
DRAPE 3/4 80X56 (DRAPES) ×3 IMPLANT
DRAPE SPLIT 6X30 W/TAPE (DRAPES) ×3 IMPLANT
DRILL QUICK RELEASE 1/8 INCH (DRILL) ×2
DRSG OPSITE POSTOP 4X10 (GAUZE/BANDAGES/DRESSINGS) ×3 IMPLANT
DRSG OPSITE POSTOP 4X12 (GAUZE/BANDAGES/DRESSINGS) ×2 IMPLANT
DRSG OPSITE POSTOP 4X8 (GAUZE/BANDAGES/DRESSINGS) ×3 IMPLANT
ELECT CAUTERY BLADE 6.4 (BLADE) ×3 IMPLANT
ELECT REM PT RETURN 9FT ADLT (ELECTROSURGICAL) ×3
ELECTRODE REM PT RTRN 9FT ADLT (ELECTROSURGICAL) ×1 IMPLANT
FEMORAL CR LEFT 65MM (Joint) ×2 IMPLANT
GLOVE BIO SURGEON STRL SZ7.5 (GLOVE) ×12 IMPLANT
GLOVE BIO SURGEON STRL SZ8 (GLOVE) ×12 IMPLANT
GLOVE BIOGEL PI IND STRL 8 (GLOVE) ×1 IMPLANT
GLOVE BIOGEL PI INDICATOR 8 (GLOVE) ×2
GLOVE INDICATOR 8.0 STRL GRN (GLOVE) ×3 IMPLANT
GOWN STRL REUS W/ TWL LRG LVL3 (GOWN DISPOSABLE) ×1 IMPLANT
GOWN STRL REUS W/ TWL XL LVL3 (GOWN DISPOSABLE) ×1 IMPLANT
GOWN STRL REUS W/TWL LRG LVL3 (GOWN DISPOSABLE) ×2
GOWN STRL REUS W/TWL XL LVL3 (GOWN DISPOSABLE) ×2
HOLDER FOLEY CATH W/STRAP (MISCELLANEOUS) ×3 IMPLANT
HOOD PEEL AWAY FLYTE STAYCOOL (MISCELLANEOUS) ×9 IMPLANT
KIT TURNOVER KIT A (KITS) ×3 IMPLANT
NDL SAFETY ECLIPSE 18X1.5 (NEEDLE) ×2 IMPLANT
NDL SPNL 20GX3.5 QUINCKE YW (NEEDLE) ×1 IMPLANT
NEEDLE HYPO 18GX1.5 SHARP (NEEDLE) ×4
NEEDLE SPNL 20GX3.5 QUINCKE YW (NEEDLE) ×3 IMPLANT
NS IRRIG 1000ML POUR BTL (IV SOLUTION) ×3 IMPLANT
PACK TOTAL KNEE (MISCELLANEOUS) ×3 IMPLANT
PAD WRAPON POLAR KNEE (MISCELLANEOUS) ×1 IMPLANT
PATELLA SERIES A (Orthopedic Implant) ×2 IMPLANT
PLATE KNEE TIBIAL 71MM FIXED (Plate) ×2 IMPLANT
PULSAVAC PLUS IRRIG FAN TIP (DISPOSABLE) ×3
SOL .9 NS 3000ML IRR  AL (IV SOLUTION) ×2
SOL .9 NS 3000ML IRR UROMATIC (IV SOLUTION) ×1 IMPLANT
STAPLER SKIN PROX 35W (STAPLE) ×3 IMPLANT
SUCTION FRAZIER HANDLE 10FR (MISCELLANEOUS) ×2
SUCTION TUBE FRAZIER 10FR DISP (MISCELLANEOUS) ×1 IMPLANT
SUT VIC AB 0 CT1 36 (SUTURE) ×9 IMPLANT
SUT VIC AB 2-0 CT1 27 (SUTURE) ×6
SUT VIC AB 2-0 CT1 TAPERPNT 27 (SUTURE) ×3 IMPLANT
SYR 10ML LL (SYRINGE) ×3 IMPLANT
SYR 20ML LL LF (SYRINGE) ×3 IMPLANT
SYR 30ML LL (SYRINGE) ×9 IMPLANT
TIBIAL BEARING VG AS 71X12 (Joint) ×3 IMPLANT
TIP FAN IRRIG PULSAVAC PLUS (DISPOSABLE) ×1 IMPLANT
TRAY FOLEY MTR SLVR 16FR STAT (SET/KITS/TRAYS/PACK) ×3 IMPLANT
WATER STERILE IRR 1000ML POUR (IV SOLUTION) ×2 IMPLANT
WRAPON POLAR PAD KNEE (MISCELLANEOUS) ×3

## 2019-03-28 NOTE — Transfer of Care (Signed)
Immediate Anesthesia Transfer of Care Note  Patient: Felicia Daniels  Procedure(s) Performed: TOTAL KNEE ARTHROPLASTY (Left Knee)  Patient Location: PACU  Anesthesia Type:Spinal  Level of Consciousness: awake, alert  and oriented  Airway & Oxygen Therapy: Patient Spontanous Breathing and Patient connected to nasal cannula oxygen  Post-op Assessment: Report given to RN and Post -op Vital signs reviewed and stable  Post vital signs: Reviewed and stable  Last Vitals:  Vitals Value Taken Time  BP    Temp    Pulse    Resp    SpO2      Last Pain:  Vitals:   03/28/19 0918  TempSrc: Temporal  PainSc: 0-No pain         Complications: No apparent anesthesia complications

## 2019-03-28 NOTE — Anesthesia Post-op Follow-up Note (Signed)
Anesthesia QCDR form completed.        

## 2019-03-28 NOTE — Progress Notes (Signed)
PT Cancellation Note  Patient Details Name: Felicia Daniels MRN: [id number] DOB: 1941/05/15   Cancelled Treatment:    Reason Eval/Treat Not Completed: Other (comment) Spoke with RN, pt is nauseated since arrival to floor and is not yet able to move her feet at this time. PT will re-attempt tomorrow.   [name], SPT   Raykwon Hobbs 03/28/2019, 3:16 PM

## 2019-03-28 NOTE — Plan of Care (Signed)

## 2019-03-28 NOTE — Progress Notes (Signed)

## 2019-03-28 NOTE — Anesthesia Preprocedure Evaluation (Addendum)
Anesthesia Evaluation  Patient identified by MRN, date of birth, ID band Patient awake    Reviewed: Allergy & Precautions, H&P , NPO status , Patient's Chart, lab work & pertinent test results  History of Anesthesia Complications (+) PONV, PROLONGED EMERGENCE and history of anesthetic complications  Airway Mallampati: II  TM Distance: >3 FB Neck ROM: full    Dental  (+) Teeth Intact   Pulmonary neg pulmonary ROS, neg COPD, neg recent URI,           Cardiovascular hypertension, (-) angina(-) Past MI negative cardio ROS  (-) dysrhythmias      Neuro/Psych negative neurological ROS  negative psych ROS   GI/Hepatic Neg liver ROS, GERD  Controlled,  Endo/Other  Hypothyroidism   Renal/GU      Musculoskeletal   Abdominal   Peds  Hematology negative hematology ROS (+)   Anesthesia Other Findings Past Medical History: No date: Anemia     Comment:  resolved No date: Arthritis No date: Cholelithiasis No date: Complication of anesthesia     Comment:  hard to wake up from pancreas blockage surgery at [facility]               In January, had to stay extra days in ICU. No date: Diverticulosis 02/08/2014: Dysphagia No date: Fibrocystic disease of breast No date: GERD (gastroesophageal reflux disease) No date: Heart murmur No date: Hypertension No date: Hypothyroidism No date: Pancreatitis No date: PONV (postoperative nausea and vomiting)  Past Surgical History: No date: ABDOMINAL HYSTERECTOMY No date: BREAST CYST ASPIRATION; Left     Comment:  neg No date: CHOLECYSTECTOMY 02/26/2003, 12/07/2008, 02/26/2014: COLONOSCOPY     Comment:  x 3, FH colon polyps 05/01/2015: ERCP 02/26/2014: ESOPHAGOGASTRODUODENOSCOPY 03/21/2018: ESOPHAGOGASTRODUODENOSCOPY (EGD) WITH PROPOFOL; N/A     Comment:  Procedure: ESOPHAGOGASTRODUODENOSCOPY (EGD) WITH               PROPOFOL;  Surgeon: Manya Silvas, MD;  Location:               [facility]  ENDOSCOPY;  Service: Endoscopy;  Laterality: N/A; 09/20/2015: FINGER ARTHROPLASTY; Right     Comment:  Procedure: FINGER ARTHROPLASTY RIGHT THUMB [place];                Surgeon: Leanor Kail, MD;  Location: [place];  Service: Orthopedics;  Laterality: Right; 2001: KNEE ARTHROSCOPY; Left No date: MENISCUS REPAIR; Right No date: PANCREAS SURGERY     Comment:  blockage surgery No date: TONSILLECTOMY No date: TRIGGER FINGER RELEASE     Reproductive/Obstetrics negative OB ROS                            Anesthesia Physical Anesthesia Plan  ASA: II  Anesthesia Plan: Spinal   Post-op Pain Management:    Induction:   PONV Risk Score and Plan: Propofol infusion  Airway Management Planned: Natural Airway and Simple Face Mask  Additional Equipment:   Intra-op Plan:   Post-operative Plan:   Informed Consent: I have reviewed the patients History and Physical, chart, labs and discussed the procedure including the risks, benefits and alternatives for the proposed anesthesia with the patient or authorized representative who has indicated his/her understanding and acceptance.     Dental Advisory Given  Plan Discussed with: Anesthesiologist, CRNA and Surgeon  Anesthesia Plan Comments:        Anesthesia  Quick Evaluation

## 2019-03-28 NOTE — Op Note (Signed)
03/28/2019  12:54 PM  Patient:   Felicia Daniels  Pre-Op Diagnosis:   Degenerative joint disease, left knee.  Post-Op Diagnosis:   Same  Procedure:   Left TKA using all-cemented [employer] Vanguard system with a 65 mm PCR femur, a 71 mm tibial tray with a 12 mm anterior stabilized E-poly insert, and a 31 x 8 mm all-poly 3-pegged domed patella.  Surgeon:   Pascal Lux, MD  Assistant:   Cameron Proud, PA-C; Talitha Givens, PA-S  Anesthesia:   Spinal  Findings:   As above  Complications:   None  EBL:   5 cc  Fluids:   1000 cc crystalloid  UOP:   300 cc  TT:   85 minutes at 300 mmHg  Drains:   None  Closure:   Staples  Implants:   As above  Brief Clinical Note:   The patient is a 78 year old female with a long history of progressively worsening left knee pain. The patient's symptoms have progressed despite medications, activity modification, injections, etc. The patient's history and examination were consistent with advanced degenerative joint disease of the right knee confirmed by plain radiographs. The patient presents at this time for a left total knee arthroplasty.  Procedure:   The patient was brought into the operating room. After adequate spinal anesthesia was obtained, the patient was lain in the supine position. A Foley catheter was placed by the nurse before the right lower extremity was prepped with ChloraPrep solution and draped sterilely. Preoperative antibiotics were administered. After verifying the proper laterality with a surgical timeout, the limb was exsanguinated with an Esmarch and the tourniquet inflated to 300 mmHg. A standard anterior approach to the knee was made through an approximately 7 inch incision. The incision was carried down through the subcutaneous tissues to expose superficial retinaculum. This was split the length of the incision and the medial flap elevated sufficiently to expose the medial retinaculum. The medial retinaculum was incised, leaving a  3-4 mm cuff of tissue on the patella. This was extended distally along the medial border of the patellar tendon and proximally through the medial third of the quadriceps tendon. A subtotal fat pad excision was performed before the soft tissues were elevated off the anteromedial and anterolateral aspects of the proximal tibia to the level of the collateral ligaments. The anterior portions of the medial and lateral menisci were removed, as was the anterior cruciate ligament. With the knee flexed to 90, the external tibial guide was positioned and the appropriate proximal tibial cut made. This piece was taken to the back table where it was measured and found to be optimally replicated by a 71 mm component.  Attention was directed to the distal femur. The intramedullary canal was accessed through a 3/8" drill hole. The intramedullary guide was inserted and positioned in order to obtain a neutral flexion gap. The intercondylar block was positioned with care taken to avoid notching the anterior cortex of the femur. The appropriate cut was made. Next, the distal cutting block was placed at 6 of valgus alignment. Using the 9 mm slot, the distal cut was made. The distal femur was measured and found to be optimally replicated by the 65 mm component. The 65 mm 4-in-1 cutting block was positioned and first the posterior, then the posterior chamfer, the anterior chamfer, and finally the anterior cuts were made. At this point, the posterior portions medial and lateral menisci were removed. A trial reduction was performed using the appropriate femoral and tibial  components with first the 10 mm and then the 12 mm insert. The 12 mm insert demonstrated excellent stability to varus and valgus stressing both in flexion and extension while permitting full extension. Patella tracking was assessed and found to be excellent. Therefore, the tibial guide position was marked on the proximal tibia. The patella thickness was measured and  found to be 21 mm. Therefore, the appropriate cut was made. The patellar surface was measured and found to be optimally replicated by the 31 mm component. The three peg holes were drilled in place before the trial button was inserted. Patella tracking was assessed and found to be excellent, passing the "no thumb test". The lug holes were drilled into the distal femur before the trial component was removed, leaving only the tibial tray. The keel was then created using the appropriate tower, reamer, and punch.  The bony surfaces were prepared for cementing by irrigating them thoroughly with bacitracin saline solution via the jet lavage system. A bone plug was fashioned from some of the bone that had been removed previously and used to plug the distal femoral canal. In addition, 20 cc of Exparel diluted out to 60 cc with normal saline and 30 cc of 0.5% Sensorcaine were injected into the postero-medial and postero-lateral aspects of the knee, the medial and lateral gutter regions, and the peri-incisional tissues to help with postoperative analgesia. Meanwhile, the cement was being mixed on the back table. When it was ready, the tibial tray was cemented in first. The excess cement was removed using [employer] fast streamer. Next, the femoral component was impacted into place. Again, the excess cement was removed using [employer] fast streamer. The 12 mm trial insert was positioned and the knee brought into extension while the cement hardened. Finally, the patella was cemented into place and secured using the patellar clamp. Again, the excess cement was removed using [employer] fast streamer. Once the cement had hardened, the knee was placed through a range of motion with the findings as described above. Therefore, the trial insert was removed and, after verifying that no cement had been retained posteriorly, the permanent 12 mm anterior stabilized E-polyethylene insert was positioned and secured using the appropriate key locking mechanism. Again  the knee was placed through a range of motion with the findings as described above.  The wound was copiously irrigated with sterile saline solution using the jet lavage system before the quadriceps tendon and retinacular layer were reapproximated using #0 Vicryl interrupted sutures. The superficial retinacular layer also was closed using a running #0 Vicryl suture. A total of 10 cc of transexemic acid (TXA) was injected intra-articularly before the subcutaneous tissues were closed in several layers using 2-0 Vicryl interrupted sutures. The skin was closed using staples. A sterile honeycomb dressing was applied to the skin before the leg was wrapped with an Ace wrap to accommodate the Polar Care device. The patient was then awakened and returned to the recovery room in satisfactory condition after tolerating the procedure well.

## 2019-03-28 NOTE — TOC Progression Note (Signed)
Transition of Care [facility]) - Progression Note    Patient Details  Name: Felicia Daniels MRN: [id number] Date of Birth: [birth year]  [facility] CM/SW Contact  Zabella Wease, Lenice Llamas Phone Number: [phone]  03/28/2019, 4:44 PM  Clinical Narrative: Lovenox price requested.            Expected Discharge Plan and Services                                                 Social Determinants of Health (SDOH) Interventions    Readmission Risk Interventions No flowsheet data found.

## 2019-03-28 NOTE — Anesthesia Procedure Notes (Signed)
Spinal  Patient location during procedure: OR Start time: 03/28/2019 10:30 AM End time: 03/28/2019 10:38 AM Staffing Resident/CRNA: Bernardo Heater, CRNA Performed: resident/CRNA  Preanesthetic Checklist Completed: patient identified, site marked, surgical consent, pre-op evaluation, timeout performed, IV checked, risks and benefits discussed and monitors and equipment checked Spinal Block Patient position: sitting Prep: ChloraPrep and site prepped and draped Patient monitoring: heart rate, continuous pulse ox, blood pressure and cardiac monitor Approach: midline Location: L2-3 Injection technique: single-shot Needle Needle type: Introducer and Pencan  Needle gauge: 24 G Needle length: 9 cm Additional Notes Negative paresthesia. Negative blood return. Positive free-flowing CSF. Expiration date of kit checked and confirmed. Patient tolerated procedure well, without complications.

## 2019-03-28 NOTE — H&P (Signed)
Paper H&P to be scanned into permanent record. H&P reviewed and patient re-examined. No changes. 

## 2019-03-29 ENCOUNTER — Encounter: Payer: Self-pay | Admitting: Surgery

## 2019-03-29 LAB — BASIC METABOLIC PANEL
Anion gap: 7 (ref 5–15)
BUN: 15 mg/dL (ref 8–23)
CO2: 24 mmol/L (ref 22–32)
Calcium: 8.2 mg/dL — ABNORMAL LOW (ref 8.9–10.3)
Chloride: 109 mmol/L (ref 98–111)
Creatinine, Ser: 0.58 mg/dL (ref 0.44–1.00)
GFR calc Af Amer: >60 mL/min (ref >60)
GFR calc non Af Amer: >60 mL/min (ref >60)
Glucose, Bld: 93 mg/dL (ref 70–99)
Potassium: 3.9 mmol/L (ref 3.5–5.1)
Sodium: 140 mmol/L (ref 135–145)

## 2019-03-29 LAB — CBC WITH DIFFERENTIAL/PLATELET
Abs Immature Granulocytes: 0.02 10*3/uL (ref 0.00–0.07)
Basophils Absolute: 0 10*3/uL (ref 0.0–0.1)
Basophils Relative: 0 %
Eosinophils Absolute: 0.1 10*3/uL (ref 0.0–0.5)
Eosinophils Relative: 1 %
HCT: 33 % — ABNORMAL LOW (ref 36.0–46.0)
Hemoglobin: 10.5 g/dL — ABNORMAL LOW (ref 12.0–15.0)
Immature Granulocytes: 0 %
Lymphocytes Relative: 27 %
Lymphs Abs: 1.9 10*3/uL (ref 0.7–4.0)
MCH: 29.9 pg (ref 26.0–34.0)
MCHC: 31.8 g/dL (ref 30.0–36.0)
MCV: 94 fL (ref 80.0–100.0)
Monocytes Absolute: 0.8 10*3/uL (ref 0.1–1.0)
Monocytes Relative: 11 %
Neutro Abs: 4.3 10*3/uL (ref 1.7–7.7)
Neutrophils Relative %: 61 %
Platelets: 242 10*3/uL (ref 150–400)
RBC: 3.51 MIL/uL — ABNORMAL LOW (ref 3.87–5.11)
RDW: 13.1 % (ref 11.5–15.5)
WBC: 7.1 10*3/uL (ref 4.0–10.5)
nRBC: 0 % (ref 0.0–0.2)

## 2019-03-29 NOTE — Progress Notes (Signed)
Subjective: 1 Day Post-Op Procedure(s) (LRB): TOTAL KNEE ARTHROPLASTY (Left) Patient reports pain as 4 on 0-10 scale.   Patient is well, and has had no acute complaints or problems PT and care management to assist with d/c planning. Negative for chest pain and shortness of breath Fever: no Gastrointestinal:Negative for nausea and vomiting this morning, did have some after coming to her hospital room from the OR.  Objective: Vital signs in last 24 hours: Temp:  [97.8 F (36.6 C)-98.4 F (36.9 C)] 97.9 F (36.6 C) (10/28 0440) Pulse Rate:  [57-75] 57 (10/28 0440) Resp:  [10-18] 16 (10/28 0440) BP: (102-179)/(41-75) 118/44 (10/28 0440) SpO2:  [95 %-100 %] 95 % (10/28 0440) Weight:  [73.3 kg] 73.3 kg (10/27 0918)  Intake/Output from previous day:  Intake/Output Summary (Last 24 hours) at 03/29/2019 0726 Last data filed at 03/29/2019 0600 Gross per 24 hour  Intake 2424.2 ml  Output 855 ml  Net 1569.2 ml    Intake/Output this shift: No intake/output data recorded.  Labs: Recent Labs    03/29/19 0458  HGB 10.5*   Recent Labs    03/29/19 0458  WBC 7.1  RBC 3.51*  HCT 33.0*  PLT 242   Recent Labs    03/29/19 0458  NA 140  K 3.9  CL 109  CO2 24  BUN 15  CREATININE 0.58  GLUCOSE 93  CALCIUM 8.2*   No results for input(s): LABPT, INR in the last 72 hours.   EXAM General - Patient is Alert, Appropriate and Oriented Extremity - ABD soft Sensation intact distally Intact pulses distally Dorsiflexion/Plantar flexion intact Incision: scant drainage No cellulitis present Dressing/Incision - blood tinged drainage along the middle of the incision. Motor Function - intact, moving foot and toes well on exam.   Past Medical History:  Diagnosis Date  . Anemia    resolved  . Arthritis   . Cholelithiasis   . Complication of anesthesia    hard to wake up from pancreas blockage surgery at [facility] In January, had to stay extra days in ICU.  Marland Kitchen Diverticulosis   .  Dysphagia 02/08/2014  . Fibrocystic disease of breast   . GERD (gastroesophageal reflux disease)   . Heart murmur   . Hypothyroidism   . Pancreatitis   . PONV (postoperative nausea and vomiting)     Assessment/Plan: 1 Day Post-Op Procedure(s) (LRB): TOTAL KNEE ARTHROPLASTY (Left) Active Problems:   Status post total knee replacement using cement, left  Estimated body mass index is 30.55 kg/m as calculated from the following:   Height as of this encounter: 5\' 1"  (1.549 m).   Weight as of this encounter: 73.3 kg.  Labs reviewed this AM, Hg 10.5. Monitor for nausea BP 118/44, monitor for any dizziness or symptoms. Up with therapy today. Begin working on BM.  DVT Prophylaxis - Lovenox, Foot Pumps and TED hose Weight-Bearing as tolerated to left leg  J. Cameron Proud, PA-C [facility] Orthopaedic Surgery 03/29/2019, 7:26 AM

## 2019-03-29 NOTE — Progress Notes (Signed)
Physical Therapy Treatment Patient Details Name: Felicia Daniels MRN: [id number] DOB: 1940-10-02 Today's Date: 03/29/2019    History of Present Illness Pt is a 78 y/o F admitted s/P L TKA 2/2 OA. PMH includes HTN, GERD, hypothyroidism, anemia, and arthritis.    PT Comments    Pt is progressing toward goals. Upon entry, pt is resting in recliner with daughter in room. This date, pt performs bed mobility, transfers, and ambulation with supervision/CGA and use of RW. Pt able to progress amb distance this afternoon. Pt will continue to benefit from skilled PT to address deficits in strength, ROM, mobility, pain management.     Follow Up Recommendations  Home health PT     Equipment Recommendations  Rolling walker with 5" wheels    Recommendations for Other Services       Precautions / Restrictions Precautions Precautions: Knee Precaution Booklet Issued: No Precaution Comments: Able to perform 10 SLR independently Restrictions Weight Bearing Restrictions: Yes LLE Weight Bearing: Weight bearing as tolerated    Mobility  Bed Mobility Overal bed mobility: Needs Assistance Bed Mobility: Sit to Supine     Supine to sit: Min guard Sit to supine: Supervision   General bed mobility comments: Pt performs return to bed with PT supervision. Does not require UE support from bedrails and performs in timely manner.   Transfers Overall transfer level: Needs assistance Equipment used: Rolling walker (2 wheeled) Transfers: Sit to/from Stand Sit to Stand: Supervision         General transfer comment: Pt still transfers with supervision only using safe technique  Ambulation/Gait Ambulation/Gait assistance: Min guard Gait Distance (Feet): 200 Feet Assistive device: Rolling walker (2 wheeled) Gait Pattern/deviations: Step-through pattern;Antalgic;Decreased stride length;Decreased stance time - left     General Gait Details: Pt again starts amb with antalgic pattern 2/2 pain but  progresses to step through pattern once stiffness decreases.   Stairs             Wheelchair Mobility    Modified Rankin (Stroke Patients Only)       Balance Overall balance assessment: Modified Independent                                          Cognition Arousal/Alertness: Awake/alert Behavior During Therapy: WFL for tasks assessed/performed Overall Cognitive Status: Within Functional Limits for tasks assessed                                        Exercises Other Exercises Other Exercises: Supine, 12 reps, bilateral: AP, QS, hip add/abd, SLR; PT supervision Other Exercises: Seated EOB, 12 reps, bilateral: LAQ; PT supervision Other Exercises: Pt able to perform bathroom activities independently; supervision provided for toilet transfers    General Comments        Pertinent Vitals/Pain Pain Assessment: 0-10 Pain Score: 4  Pain Location: L knee Pain Descriptors / Indicators: Sore;Tightness;Discomfort Pain Intervention(s): Limited activity within patient's tolerance;Monitored during session;Repositioned;Ice applied    Home Living Family/patient expects to be discharged to:: Private residence Living Arrangements: Alone Available Help at Discharge: Family;Available PRN/intermittently Type of Home: House Home Access: Level entry   Home Layout: Multi-level Home Equipment: None Additional Comments: Pt amb community distances without AD; has daughter and sister nearby to help her following hospital stay  Prior Function Level of Independence: Independent      Comments: Pt reports independence in all ADL's/iADL's   PT Goals (current goals can now be found in the care plan section) Acute Rehab PT Goals Patient Stated Goal: to return home PT Goal Formulation: With patient Time For Goal Achievement: 04/12/19 Potential to Achieve Goals: Good Progress towards PT goals: Progressing toward goals    Frequency    BID       PT Plan Current plan remains appropriate    Co-evaluation              AM-PAC PT "6 Clicks" Mobility   Outcome Measure  Help needed turning from your back to your side while in a flat bed without using bedrails?: None Help needed moving from lying on your back to sitting on the side of a flat bed without using bedrails?: None Help needed moving to and from a bed to a chair (including a wheelchair)?: A Little Help needed standing up from a chair using your arms (e.g., wheelchair or bedside chair)?: A Little Help needed to walk in hospital room?: A Little Help needed climbing 3-5 steps with a railing? : A Little 6 Click Score: 20    End of Session Equipment Utilized During Treatment: Gait belt Activity Tolerance: Patient tolerated treatment well Patient left: in bed;with bed alarm set;with family/visitor present;with call bell/phone within reach(case management in room) Nurse Communication: Mobility status PT Visit Diagnosis: Muscle weakness (generalized) (M62.81);Pain;Difficulty in walking, not elsewhere classified (R26.2) Pain - Right/Left: Left Pain - part of body: Knee     Time: [id number] PT Time Calculation (min) (ACUTE ONLY): 32 min  Charges:  $Therapeutic Exercise: 8-22 mins $Therapeutic Activity: 8-22 mins                     Felicia Daniels, SPT    Felicia Daniels 03/29/2019, 2:08 PM

## 2019-03-29 NOTE — Evaluation (Signed)
Physical Therapy Evaluation Patient Details Name: Felicia Daniels MRN: [id number] DOB: 02-21-1941 Today's Date: 03/29/2019   History of Present Illness  Pt is a 78 y/o F admitted s/P L TKA 2/2 OA. PMH includes HTN, GERD, hypothyroidism, anemia, and arthritis.  Clinical Impression  Pt is a pleasant 78 year old F who was admitted following L TKA. Upon entry, pt is resting in bed reporting 0/10 pain at rest. Pain increases to 6/10 when amb but is "tolerable". Pt performs bed mobility, transfers, and ambulation with supervision/CGA and use of RW. Pt demonstrates deficits with strength, ROM, mobility, pain management. Pt will benefit from skilled PT to address above deficits; current follow-up care recommendation is HHPT.      Follow Up Recommendations Home health PT    Equipment Recommendations  Rolling walker with 5" wheels    Recommendations for Other Services       Precautions / Restrictions Precautions Precautions: Knee Precaution Booklet Issued: No Precaution Comments: Able to perform 10 SLR independently Restrictions Weight Bearing Restrictions: Yes LLE Weight Bearing: Weight bearing as tolerated      Mobility  Bed Mobility Overal bed mobility: Needs Assistance Bed Mobility: Supine to Sit     Supine to sit: Min guard     General bed mobility comments: Pt performs bed mobility tasks with minimal use of bedrails. CGA of LE 2/2 TKA  Transfers Overall transfer level: Needs assistance Equipment used: Rolling walker (2 wheeled) Transfers: Sit to/from Stand Sit to Stand: Supervision         General transfer comment: Pt performs sit<>stand with supervision only. Able to perform with safe technique and no delay  Ambulation/Gait Ambulation/Gait assistance: Min guard Gait Distance (Feet): 90 Feet Assistive device: Rolling walker (2 wheeled) Gait Pattern/deviations: Step-through pattern;Antalgic;Decreased stride length;Decreased stance time - left     General Gait  Details: Pt performs gait initially with antalgic pattern (reduced stance on L LE, decreased stride length). Progresses quickly to step through pattern.   Stairs            Wheelchair Mobility    Modified Rankin (Stroke Patients Only)       Balance Overall balance assessment: Modified Independent(Pt able to reach upright posture with mod independence (RW))                                           Pertinent Vitals/Pain Pain Assessment: 0-10 Pain Score: 6  Pain Location: L knee Pain Descriptors / Indicators: Sore;Tightness;Discomfort Pain Intervention(s): Limited activity within patient's tolerance;Monitored during session;Repositioned;Ice applied    Home Living Family/patient expects to be discharged to:: Private residence Living Arrangements: Alone Available Help at Discharge: Family;Available PRN/intermittently Type of Home: House Home Access: Level entry     Home Layout: Multi-level Home Equipment: None Additional Comments: Pt amb community distances without AD; has daughter and sister nearby to help her following hospital stay    Prior Function Level of Independence: Independent         Comments: Pt reports independence in all ADL's/iADL's     Hand Dominance        Extremity/Trunk Assessment   Upper Extremity Assessment Upper Extremity Assessment: Overall WFL for tasks assessed    Lower Extremity Assessment Lower Extremity Assessment: Overall WFL for tasks assessed    Cervical / Trunk Assessment Cervical / Trunk Assessment: Kyphotic  Communication   Communication: No difficulties  Cognition  Arousal/Alertness: Awake/alert Behavior During Therapy: WFL for tasks assessed/performed Overall Cognitive Status: Within Functional Limits for tasks assessed                                        General Comments      Exercises Other Exercises Other Exercises: Supine, 12 reps, bilateral: AP, QS, SLR; PT  supervision Other Exercises: Seated EOB, 12 reps, bilateral: LAQ; PT supervision Other Exercises: Pt able to perform bathroom activities independently; CGA provided for toilet transfers   Assessment/Plan    PT Assessment Patient needs continued PT services  PT Problem List Decreased strength;Decreased range of motion;Decreased activity tolerance;Decreased balance;Decreased mobility;Pain       PT Treatment Interventions DME instruction;Gait training;Stair training;Functional mobility training;Therapeutic activities;Therapeutic exercise;Balance training;Patient/family education    PT Goals (Current goals can be found in the Care Plan section)  Acute Rehab PT Goals Patient Stated Goal: to return home PT Goal Formulation: With patient Time For Goal Achievement: 04/12/19 Potential to Achieve Goals: Good    Frequency BID   Barriers to discharge        Co-evaluation               AM-PAC PT "6 Clicks" Mobility  Outcome Measure Help needed turning from your back to your side while in a flat bed without using bedrails?: None Help needed moving from lying on your back to sitting on the side of a flat bed without using bedrails?: None Help needed moving to and from a bed to a chair (including a wheelchair)?: A Little Help needed standing up from a chair using your arms (e.g., wheelchair or bedside chair)?: A Little Help needed to walk in hospital room?: A Little Help needed climbing 3-5 steps with a railing? : A Little 6 Click Score: 20    End of Session Equipment Utilized During Treatment: Gait belt Activity Tolerance: Patient tolerated treatment well Patient left: in chair;with chair alarm set;with SCD's reapplied;with call bell/phone within reach Nurse Communication: Mobility status PT Visit Diagnosis: Muscle weakness (generalized) (M62.81);Pain;Difficulty in walking, not elsewhere classified (R26.2) Pain - Right/Left: Left Pain - part of body: Knee    Time: [id number] PT  Time Calculation (min) (ACUTE ONLY): 40 min   Charges:              Felicia Daniels, SPT   Felicia Daniels 03/29/2019, 12:10 PM

## 2019-03-29 NOTE — TOC Initial Note (Signed)
Transition of Care [facility]) - Initial/Assessment Note    Patient Details  Name: Felicia Daniels MRN: [id number] Date of Birth: May 06, 1941  [facility] CM/SW Contact:    Rendon Howell, Lenice Llamas Phone Number: [phone]  03/29/2019, 5:15 PM  Clinical Narrative: PT is recommending home health. Clinical Social Worker (CSW) met with patient and her daughter Arrie Aran was at bedside. Patient was alert and oriented X4 and was sitting up in the bed. CSW introduced self and explained role of CSW department. Patient reported that she lives alone in [place] and her daughter will be staying with her until Nov. 3rd. Patient reported that after her daughter leaves her sister Santiago Glad will be staying with her. Patient requested a rolling walker and bedside commode. [employer] D[employer] agency representative is aware of above. Patient is agreeable to home health and does not have a home health agency preference. Per Lauretta Chester home health representative they can accept patient. Patient is agreeable to [facility] and was provided with CMS home health list. Patient is aware her Lovenox price is $166.40 and was provided a good Rx card. CSW explained that if patient uses the good Rx it will not count towards her deducible. Patient verbalized her understanding. CSW will continue to follow and assist as needed.               Expected Discharge Plan: [place] Barriers to Discharge: Continued Medical Work up   Patient Goals and CMS Choice Patient states their goals for this hospitalization and ongoing recovery are:: To go home. CMS Medicare.gov Compare Post Acute Care list provided to:: Patient Choice offered to / list presented to : Patient  Expected Discharge Plan and Services Expected Discharge Plan: [place] In-house Referral: Clinical Social Work   Post Acute Care Choice: [place] arrangements for the past 2 months: [place]                 DME  Arranged: Bedside commode, Walker rolling DME Agency: [employer] Date DME Agency Contacted: 03/29/19 Time DME Agency Contacted: 1030 Representative spoke with at DME Agency: [place]: PT [place]: [facility] [place] Date [place]: 03/29/19 Time HH Agency Contacted: 1000 Representative spoke with at [place]: [place] Arrangements/Services Living arrangements for the past 2 months: [place] with:: Self Patient language and need for interpreter reviewed:: No Do you feel safe going back to the place where you live?: Yes      Need for Family Participation in Patient Care: No (Comment) Care giver support system in place?: Yes (comment)   Criminal Activity/Legal Involvement Pertinent to Current Situation/Hospitalization: No - Comment as needed  Activities of Daily Living Home Assistive Devices/Equipment: Eyeglasses ADL Screening (condition at time of admission) Patient's cognitive ability adequate to safely complete daily activities?: Yes Is the patient deaf or have difficulty hearing?: Yes Does the patient have difficulty seeing, even when wearing glasses/contacts?: No Does the patient have difficulty concentrating, remembering, or making decisions?: No Patient able to express need for assistance with ADLs?: Yes Does the patient have difficulty dressing or bathing?: Yes Independently performs ADLs?: No Does the patient have difficulty walking or climbing stairs?: Yes Weakness of Legs: Left Weakness of Arms/Hands: None  Permission Sought/Granted Permission sought to share information with : Other (comment)(Home Health agency and DME agency.) Permission granted to share information with : Yes, Verbal Permission Granted  Emotional Assessment Appearance:: Appears stated age Attitude/Demeanor/Rapport: Engaged Affect (typically observed): Pleasant, Calm Orientation: : Oriented to Self, Oriented to Place, Oriented to   Time, Oriented to Situation Alcohol / Substance Use: Not Applicable Psych Involvement: No (comment)  Admission diagnosis:  PRIMARY OSTEOARTHRITIS OF LEFT KNEE. Patient Active Problem List   Diagnosis Date Noted  . Status post total knee replacement using cement, left 03/28/2019  . Pancreatitis 02/09/2015  . Acute pancreatitis 01/28/2015   PCP:  Kirk Ruths, MD Pharmacy:   [id number] [place] [place] [address]Phone: [phone]: [phone]    Social Determinants of Health (SDOH) Interventions    Readmission Risk Interventions No flowsheet data found.

## 2019-03-29 NOTE — Anesthesia Postprocedure Evaluation (Signed)
Anesthesia Post Note  Patient: Felicia Daniels  Procedure(s) Performed: TOTAL KNEE ARTHROPLASTY (Left Knee)  Patient location during evaluation: Nursing Unit Anesthesia Type: Spinal Level of consciousness: oriented and awake and alert Pain management: pain level controlled Vital Signs Assessment: post-procedure vital signs reviewed and stable Respiratory status: spontaneous breathing and respiratory function stable Cardiovascular status: blood pressure returned to baseline and stable Postop Assessment: no headache, no backache, no apparent nausea or vomiting and patient able to bend at knees Anesthetic complications: no     Last Vitals:  Vitals:   03/29/19 0440 03/29/19 0742  BP: (!) 118/44 (!) 122/45  Pulse: (!) 57 (!) 51  Resp: 16 18  Temp: 36.6 C   SpO2: 95% 98%    Last Pain:  Vitals:   03/29/19 0440  TempSrc: Oral  PainSc:                  Caryl Asp

## 2019-03-29 NOTE — TOC Benefit Eligibility Note (Signed)
Transition of Care [facility]) Benefit Eligibility Note    Patient Details  Name: Felicia Daniels MRN: [id number] Date of Birth: 1940-07-15   Medication/Dose: Enoxaparin 40mg  once daily for 14 days  Covered?: Yes  Prescription Coverage [facility]  Spoke with Person/Company/Phone Number:: Neil Crouch at [facility] at [phone]  Co-Pay: $166.40 estimated copay  Prior Approval: No  Deductible: Unmet($150 deductible, unmet as of time of call)    Felicia Daniels Phone Number: [phone] 03/29/2019, 10:23 AM

## 2019-03-30 LAB — BASIC METABOLIC PANEL
Anion gap: 5 (ref 5–15)
BUN: 14 mg/dL (ref 8–23)
CO2: 27 mmol/L (ref 22–32)
Calcium: 8.3 mg/dL — ABNORMAL LOW (ref 8.9–10.3)
Chloride: 106 mmol/L (ref 98–111)
Creatinine, Ser: 0.55 mg/dL (ref 0.44–1.00)
GFR calc Af Amer: >60 mL/min (ref >60)
GFR calc non Af Amer: >60 mL/min (ref >60)
Glucose, Bld: 110 mg/dL — ABNORMAL HIGH (ref 70–99)
Potassium: 4 mmol/L (ref 3.5–5.1)
Sodium: 138 mmol/L (ref 135–145)

## 2019-03-30 LAB — CBC
HCT: 30.6 % — ABNORMAL LOW (ref 36.0–46.0)
Hemoglobin: 9.8 g/dL — ABNORMAL LOW (ref 12.0–15.0)
MCH: 30 pg (ref 26.0–34.0)
MCHC: 32 g/dL (ref 30.0–36.0)
MCV: 93.6 fL (ref 80.0–100.0)
Platelets: 248 10*3/uL (ref 150–400)
RBC: 3.27 MIL/uL — ABNORMAL LOW (ref 3.87–5.11)
RDW: 12.9 % (ref 11.5–15.5)
WBC: 8.8 10*3/uL (ref 4.0–10.5)
nRBC: 0 % (ref 0.0–0.2)

## 2019-03-30 MED ORDER — TRAMADOL HCL 50 MG PO TABS: 50.0000 mg | ORAL_TABLET | Freq: Four times a day (QID) | ORAL | 0 refills | Status: AC | PRN

## 2019-03-30 MED ORDER — OXYCODONE HCL 5 MG PO TABS: 5.0000 mg | ORAL_TABLET | ORAL | 0 refills | Status: DC | PRN

## 2019-03-30 MED ORDER — ENOXAPARIN SODIUM 40 MG/0.4ML ~~LOC~~ SOLN: 40.0000 mg | SUBCUTANEOUS | 0 refills | Status: DC

## 2019-03-30 NOTE — Progress Notes (Signed)
Subjective: 2 Days Post-Op Procedure(s) (LRB): TOTAL KNEE ARTHROPLASTY (Left) Patient reports pain as 5 on 0-10 scale.   Patient is well, and has had no acute complaints or problems Plan for discharge home with HHPT. Negative for chest pain and shortness of breath Fever: no Gastrointestinal:Negative for nausea and vomiting this morning Patient was able to walk 200 ft yesterday with PT.  Objective: Vital signs in last 24 hours: Temp:  [98 F (36.7 C)-99.2 F (37.3 C)] 99.2 F (37.3 C) (10/28 2300) Pulse Rate:  [61-74] 74 (10/28 2300) Resp:  [16-18] 16 (10/28 2300) BP: (139-155)/(50-57) 139/50 (10/28 2300) SpO2:  [92 %-100 %] 92 % (10/28 2300)  Intake/Output from previous day:  Intake/Output Summary (Last 24 hours) at 03/30/2019 0752 Last data filed at 03/29/2019 1921 Gross per 24 hour  Intake 600 ml  Output -  Net 600 ml    Intake/Output this shift: No intake/output data recorded.  Labs: Recent Labs    03/29/19 0458 03/30/19 0444  HGB 10.5* 9.8*   Recent Labs    03/29/19 0458 03/30/19 0444  WBC 7.1 8.8  RBC 3.51* 3.27*  HCT 33.0* 30.6*  PLT 242 248   Recent Labs    03/29/19 0458 03/30/19 0444  NA 140 138  K 3.9 4.0  CL 109 106  CO2 24 27  BUN 15 14  CREATININE 0.58 0.55  GLUCOSE 93 110*  CALCIUM 8.2* 8.3*   No results for input(s): LABPT, INR in the last 72 hours.   EXAM General - Patient is Alert, Appropriate and Oriented Extremity - ABD soft Sensation intact distally Intact pulses distally Dorsiflexion/Plantar flexion intact Incision: scant drainage No cellulitis present Dressing/Incision - blood tinged drainage along the middle of the incision. Motor Function - intact, moving foot and toes well on exam.   Past Medical History:  Diagnosis Date  . Anemia    resolved  . Arthritis   . Cholelithiasis   . Complication of anesthesia    hard to wake up from pancreas blockage surgery at [facility] In January, had to stay extra days in ICU.  Marland Kitchen  Diverticulosis   . Dysphagia 02/08/2014  . Fibrocystic disease of breast   . GERD (gastroesophageal reflux disease)   . Heart murmur   . Hypothyroidism   . Pancreatitis   . PONV (postoperative nausea and vomiting)    Assessment/Plan: 2 Days Post-Op Procedure(s) (LRB): TOTAL KNEE ARTHROPLASTY (Left) Active Problems:   Status post total knee replacement using cement, left  Estimated body mass index is 30.55 kg/m as calculated from the following:   Height as of this encounter: 5\' 1"  (1.549 m).   Weight as of this encounter: 73.3 kg.  Labs reviewed this AM, Hg 9.8. Low grade temp last night, WBC 8.8 this morning. Encouraged incentive spirometer. 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 Patient has had a BM. Plan for possible d/c home this afternoon with HHPT.  DVT Prophylaxis - Lovenox, Foot Pumps and TED hose Weight-Bearing as tolerated to left leg  J. Cameron Proud, PA-C [facility] Orthopaedic Surgery 03/30/2019, 7:52 AM

## 2019-03-30 NOTE — Progress Notes (Signed)
Physical Therapy Treatment Patient Details Name: Felicia Daniels MRN: [id number] DOB: [birth year] Today's Date: 03/30/2019    History of Present Illness Pt is a 78 y/o F admitted s/P L TKA 2/2 OA. PMH includes HTN, GERD, hypothyroidism, anemia, and arthritis.    PT Comments    Pt is progressing toward goals. Upon entry, pt is resting in recliner stating she has increased pain and has not yet received medicine. This date, pt performs bed mobility, transfers, and ambulation with supervision/CGA and use of RW. Pt was able to perform stairs safely. Pt will continue to benefit from skilled PT to address deficits in strength, ROM, mobility, and pain management.     Follow Up Recommendations  Home health PT     Equipment Recommendations  Rolling walker with 5" wheels    Recommendations for Other Services       Precautions / Restrictions Precautions Precautions: Knee Precaution Booklet Issued: No Restrictions Weight Bearing Restrictions: Yes LLE Weight Bearing: Weight bearing as tolerated    Mobility  Bed Mobility Overal bed mobility: Needs Assistance Bed Mobility: Sit to Supine       Sit to supine: Supervision   General bed mobility comments: Pt able to return to bed with CGA of L LE. Pt is in significant pain  Transfers Overall transfer level: Needs assistance Equipment used: Rolling walker (2 wheeled) Transfers: Sit to/from Stand Sit to Stand: Supervision         General transfer comment: Pt still transfers with supervision only using safe technique  Ambulation/Gait Ambulation/Gait assistance: Min guard Gait Distance (Feet): 150 Feet Assistive device: Rolling walker (2 wheeled) Gait Pattern/deviations: Step-to pattern;Antalgic;Decreased stride length;Decreased stance time - left     General Gait Details: Pt amb with antalgic pattern 2/2 pain.   Stairs Stairs: Yes Stairs assistance: Min guard Stair Management: No rails;With walker Number of Stairs:  1 General stair comments: Pt able to perform stairs safely despite significant pain using RW and step-to pattern   Wheelchair Mobility    Modified Rankin (Stroke Patients Only)       Balance Overall balance assessment: Modified Independent                                          Cognition Arousal/Alertness: Awake/alert Behavior During Therapy: WFL for tasks assessed/performed Overall Cognitive Status: Within Functional Limits for tasks assessed                                        Exercises Total Joint Exercises Goniometric ROM: L LE ROM: -5-85 Other Exercises Other Exercises: Supine, 12 reps, bilateral: AP, QS, hip add/abd; PT supervision Other Exercises: Seated EOB, 8 reps, bilateral: LAQ; PT supervision    General Comments General comments (skin integrity, edema, etc.): Pt has significantly increased pain today; reports she has tried to get pain medication multiple times. She is agreeable to attempt stairs despite pain to return home      Pertinent Vitals/Pain Pain Assessment: 0-10 Pain Score: 8  Pain Location: L knee Pain Descriptors / Indicators: Sore;Tightness;Discomfort Pain Intervention(s): Limited activity within patient's tolerance;Repositioned;Monitored during session;Ice applied    Home Living                      Prior Function  PT Goals (current goals can now be found in the care plan section) Acute Rehab PT Goals Patient Stated Goal: to return home PT Goal Formulation: With patient Time For Goal Achievement: 04/12/19 Potential to Achieve Goals: Good Progress towards PT goals: Progressing toward goals    Frequency    BID      PT Plan Current plan remains appropriate    Co-evaluation              AM-PAC PT "6 Clicks" Mobility   Outcome Measure  Help needed turning from your back to your side while in a flat bed without using bedrails?: None Help needed moving from lying on  your back to sitting on the side of a flat bed without using bedrails?: None Help needed moving to and from a bed to a chair (including a wheelchair)?: A Little Help needed standing up from a chair using your arms (e.g., wheelchair or bedside chair)?: A Little Help needed to walk in hospital room?: A Little Help needed climbing 3-5 steps with a railing? : A Little 6 Click Score: 20    End of Session Equipment Utilized During Treatment: Gait belt Activity Tolerance: Patient limited by pain Patient left: in bed;with bed alarm set;with call bell/phone within reach;with nursing/sitter in room;with SCD's reapplied Nurse Communication: Mobility status PT Visit Diagnosis: Muscle weakness (generalized) (M62.81);Pain;Difficulty in walking, not elsewhere classified (R26.2) Pain - Right/Left: Left Pain - part of body: Knee     Time: 0910-0940 PT Time Calculation (min) (ACUTE ONLY): 30 min  Charges:                        [name], SPT   Evan Osburn 03/30/2019, 10:28 AM

## 2019-03-30 NOTE — Progress Notes (Signed)
D: Pt alert and oriented. Pt experiencing pain throughout day in surgical leg, prn medication administered per orders along w/repositioning pt for comfort. Lovenox teach was given using teach back, visual, and ask me three method.  A: Scheduled medications administered to pt, per MD orders. Support and encouragement provided. Frequent verbal contact made.  R: No adverse drug reactions noted. Pt complaint with medications and treatment plan. Pt interacts well with staff on the unit. Pt is stable, will continue to monitor and provide care as ordered.

## 2019-03-30 NOTE — Discharge Summary (Addendum)
Physician Discharge Summary  Patient ID: Felicia Daniels MRN: [id number] DOB/AGE: January 13, 1941 78 y.o.  Admit date: 03/28/2019 Discharge date: 03/31/2019  Admission Diagnoses:  PRIMARY OSTEOARTHRITIS OF LEFT KNEE.  Discharge Diagnoses: Patient Active Problem List   Diagnosis Date Noted  . Status post total knee replacement using cement, left 03/28/2019  . Pancreatitis 02/09/2015  . Acute pancreatitis 01/28/2015    Past Medical History:  Diagnosis Date  . Anemia    resolved  . Arthritis   . Cholelithiasis   . Complication of anesthesia    hard to wake up from pancreas blockage surgery at [facility] In January, had to stay extra days in ICU.  Marland Kitchen Diverticulosis   . Dysphagia 02/08/2014  . Fibrocystic disease of breast   . GERD (gastroesophageal reflux disease)   . Heart murmur   . Hypothyroidism   . Pancreatitis   . PONV (postoperative nausea and vomiting)      Transfusion: None.   Consultants (if any):   Discharged Condition: Improved  Hospital Course: Felicia Daniels is an 78 y.o. female who was admitted 03/28/2019 with a diagnosis of primary osteoarthritis of the left knee and went to the operating room on 03/28/2019 and underwent the above named procedures.    Surgeries: Procedure(s): TOTAL KNEE ARTHROPLASTY on 03/28/2019 Patient tolerated the surgery well. Taken to PACU where she was stabilized and then transferred to the orthopedic floor.  Started on Lovenox 40mg  q 24 hrs. Foot pumps applied bilaterally at 80 mm. Heels elevated on bed with rolled towels. No evidence of DVT. Negative Homan. Physical therapy started on day #1 for gait training and transfer. OT started day #1 for ADL and assisted devices.  Patient's IV was removed on POD2.  Foley removed on POD1.  Implants: Left TKA using all-cemented [employer] Vanguard system with a 65 mm PCR femur, a 71 mm tibial tray with a 12 mm anterior stabilized E-poly insert, and a 31 x 8 mm all-poly 3-pegged domed patella.  She  was given perioperative antibiotics:  Anti-infectives (From admission, onward)   Start     Dose/Rate Route Frequency Ordered Stop   03/28/19 1600  clindamycin (CLEOCIN) IVPB 600 mg     600 mg 100 mL/hr over 30 Minutes Intravenous Every 6 hours 03/28/19 1458 03/29/19 0555   03/28/19 0936  clindamycin (CLEOCIN) 900 MG/50ML IVPB    Note to Pharmacy: Calton Dach   : cabinet override      03/28/19 0936 03/28/19 1048   03/27/19 2245  clindamycin (CLEOCIN) IVPB 900 mg     900 mg 100 mL/hr over 30 Minutes Intravenous  Once 03/27/19 2244 03/28/19 1103    .  She was given sequential compression devices, early ambulation, and Lovenox for DVT prophylaxis.  She benefited maximally from the hospital stay and there were no complications.    Recent vital signs:  Vitals:   03/30/19 1626 03/30/19 2348  BP: (!) 134/46 (!) 137/50  Pulse: 79 78  Resp: 15 18  Temp: 98.4 F (36.9 C) 98.6 F (37 C)  SpO2: 98% 93%    Recent laboratory studies:  Lab Results  Component Value Date   HGB 9.8 (L) 03/30/2019   HGB 10.5 (L) 03/29/2019   HGB 13.0 03/20/2019   Lab Results  Component Value Date   WBC 8.8 03/30/2019   PLT 248 03/30/2019   No results found for: INR Lab Results  Component Value Date   NA 138 03/31/2019   K 3.6 03/31/2019   CL 103  03/31/2019   CO2 26 03/31/2019   BUN 14 03/31/2019   CREATININE 0.58 03/31/2019   GLUCOSE 122 (H) 03/31/2019    Discharge Medications:   Allergies as of 03/31/2019      Reactions   Synthroid [levothyroxine Sodium] Shortness Of Breath, Nausea And Vomiting   Reaction: like she was having a heart attack. Sweats, nausea, felt like she couldn't breath.   Ampicillin Itching   Did it involve swelling of the face/tongue/throat, SOB, or low BP? No Did it involve sudden or severe rash/hives, skin peeling, or any reaction on the inside of your mouth or nose? No Did you need to seek medical attention at a hospital or doctor's office? No When did it last  happen?30 years ago If all above answers are "NO", may proceed with cephalosporin use.   Keflex [cephalexin] Rash   Penicillins Rash   Did it involve swelling of the face/tongue/throat, SOB, or low BP? No Did it involve sudden or severe rash/hives, skin peeling, or any reaction on the inside of your mouth or nose? No Did you need to seek medical attention at a hospital or doctor's office? No When did it last happen?30 years ago If all above answers are "NO", may proceed with cephalosporin use.   Tape Rash   Blisters to plastic tape      Medication List    STOP taking these medications   aspirin EC 81 MG tablet     TAKE these medications   acetaminophen 500 MG tablet Commonly known as: TYLENOL Take 1,000 mg by mouth every 6 (six) hours as needed for moderate pain.   enoxaparin 40 MG/0.4ML injection Commonly known as: LOVENOX Inject 0.4 mLs (40 mg total) into the skin daily.   Ferrous Fumarate 86 (27 Fe) MG Caps Take 27 mg of iron by mouth daily.   Multi-Vitamins Tabs Take 1 tablet by mouth daily.   omeprazole 20 MG capsule Commonly known as: PRILOSEC Take 20 mg by mouth daily.   oxyCODONE 5 MG immediate release tablet Commonly known as: Oxy IR/ROXICODONE Take 1-2 tablets (5-10 mg total) by mouth every 4 (four) hours as needed for moderate pain.   risedronate 35 MG tablet Commonly known as: ACTONEL Take 35 mg by mouth every 7 (seven) days. with water on empty stomach, nothing by mouth or lie down for next 30 minutes.   traMADol 50 MG tablet Commonly known as: ULTRAM Take 1-2 tablets (50-100 mg total) by mouth every 6 (six) hours as needed for moderate pain.   ursodiol 500 MG tablet Commonly known as: ACTIGALL Take 500 mg by mouth 2 (two) times daily.   Vitamin D3 50 MCG (2000 UT) capsule Take 2,000 Units by mouth daily.            Durable Medical Equipment  (From admission, onward)         Start     Ordered   03/28/19 1459  DME Bedside  commode  Once    Question:  Patient needs a bedside commode to treat with the following condition  Answer:  Status post total knee replacement using cement, left   03/28/19 1458   03/28/19 1459  DME 3 n 1  Once     03/28/19 1458   03/28/19 1459  DME Walker rolling  Once    Question:  Patient needs a walker to treat with the following condition  Answer:  Status post total knee replacement using cement, left   03/28/19 1458  Diagnostic Studies: Dg Knee Left Port  Result Date: 03/28/2019 CLINICAL DATA:  Post LEFT knee replacement EXAM: PORTABLE LEFT KNEE - 1-2 VIEW COMPARISON:  MR LEFT knee 04/15/2017 FINDINGS: Osseous demineralization. Components of a LEFT knee prosthesis are identified. No fracture, dislocation, or bone destruction. No periprosthetic lucencies. Expected anterior soft tissue changes related to surgery. IMPRESSION: LEFT knee prosthesis without acute complication. Electronically Signed   By: Lavonia Dana M.D.   On: 03/28/2019 13:48   Disposition: Plan for possible d/c home this afternoon pending progress with PT.  Follow-up Information    Reche Dixon, [place] On 04/11/2019.   Specialty: Orthopedic Surgery Why: at [place] information: [place] [address] [phone]          Signed: Judson Roch PA-C 03/31/2019, 7:28 AM

## 2019-03-30 NOTE — Discharge Instructions (Signed)

## 2019-03-31 LAB — BASIC METABOLIC PANEL
Anion gap: 9 (ref 5–15)
BUN: 14 mg/dL (ref 8–23)
CO2: 26 mmol/L (ref 22–32)
Calcium: 8.5 mg/dL — ABNORMAL LOW (ref 8.9–10.3)
Chloride: 103 mmol/L (ref 98–111)
Creatinine, Ser: 0.58 mg/dL (ref 0.44–1.00)
GFR calc Af Amer: >60 mL/min (ref >60)
GFR calc non Af Amer: >60 mL/min (ref >60)
Glucose, Bld: 122 mg/dL — ABNORMAL HIGH (ref 70–99)
Potassium: 3.6 mmol/L (ref 3.5–5.1)
Sodium: 138 mmol/L (ref 135–145)

## 2019-03-31 NOTE — Care Management Important Message (Signed)
Important Message  Patient Details  Name: Felicia Daniels MRN: [id number] Date of Birth: [birth year]   Medicare Important Message Given:  Yes     Juliann Pulse A Eliah Ozawa 03/31/2019, 10:40 AM

## 2019-03-31 NOTE — Progress Notes (Signed)
Subjective: 3 Days Post-Op Procedure(s) (LRB): TOTAL KNEE ARTHROPLASTY (Left) Patient reports pain as 5 on 0-10 scale.   Patient is well, and has had no acute complaints or problems Plan for discharge home with HHPT. Negative for chest pain and shortness of breath Fever: no Gastrointestinal:Negative for nausea and vomiting this morning Patient was able to walk 200 ft with PT and has cleared stairs.  Objective: Vital signs in last 24 hours: Temp:  [98.4 F (36.9 C)-98.6 F (37 C)] 98.6 F (37 C) (10/29 2348) Pulse Rate:  [72-79] 78 (10/29 2348) Resp:  [15-18] 18 (10/29 2348) BP: (122-137)/(46-53) 137/50 (10/29 2348) SpO2:  [93 %-100 %] 93 % (10/29 2348)  Intake/Output from previous day:  Intake/Output Summary (Last 24 hours) at 03/31/2019 0727 Last data filed at 03/30/2019 1900 Gross per 24 hour  Intake 0 ml  Output -  Net 0 ml    Intake/Output this shift: No intake/output data recorded.  Labs: Recent Labs    03/29/19 0458 03/30/19 0444  HGB 10.5* 9.8*   Recent Labs    03/29/19 0458 03/30/19 0444  WBC 7.1 8.8  RBC 3.51* 3.27*  HCT 33.0* 30.6*  PLT 242 248   Recent Labs    03/30/19 0444 03/31/19 0532  NA 138 138  K 4.0 3.6  CL 106 103  CO2 27 26  BUN 14 14  CREATININE 0.55 0.58  GLUCOSE 110* 122*  CALCIUM 8.3* 8.5*   No results for input(s): LABPT, INR in the last 72 hours.   EXAM General - Patient is Alert, Appropriate and Oriented Extremity - ABD soft Sensation intact distally Intact pulses distally Dorsiflexion/Plantar flexion intact Incision: scant drainage No cellulitis present Dressing/Incision - Old blood tinged drainage to the middle of the incision. Motor Function - intact, moving foot and toes well on exam.   Past Medical History:  Diagnosis Date  . Anemia    resolved  . Arthritis   . Cholelithiasis   . Complication of anesthesia    hard to wake up from pancreas blockage surgery at [facility] In January, had to stay extra days in  ICU.  Marland Kitchen Diverticulosis   . Dysphagia 02/08/2014  . Fibrocystic disease of breast   . GERD (gastroesophageal reflux disease)   . Heart murmur   . Hypothyroidism   . Pancreatitis   . PONV (postoperative nausea and vomiting)    Assessment/Plan: 3 Days Post-Op Procedure(s) (LRB): TOTAL KNEE ARTHROPLASTY (Left) Active Problems:   Status post total knee replacement using cement, left  Estimated body mass index is 30.55 kg/m as calculated from the following:   Height as of this encounter: 5\' 1"  (1.549 m).   Weight as of this encounter: 73.3 kg.  No recent temps. Patient has cleared all goals with PT. Patient has had a BM. D/C home today with HHPT.  DVT Prophylaxis - Lovenox, Foot Pumps and TED hose Weight-Bearing as tolerated to left leg  J. Cameron Proud, PA-C [facility] Orthopaedic Surgery 03/31/2019, 7:27 AM

## 2019-03-31 NOTE — TOC Transition Note (Signed)
Transition of Care [facility]) - CM/SW Discharge Note   Patient Details  Name: Felicia Daniels MRN: [id number] Date of Birth: 30-Jun-1940  Transition of Care [facility]) CM/SW Contact:  Lajune Perine, Lenice Llamas Phone Number: [phone]  03/31/2019, 1:04 PM   Clinical Narrative: Clinical Social Worker (CSW) made [place] home health representative aware that patient will D/C home today. [employer] D[employer] agency representative is aware that patient will need a rolling walker and bedside commode. Patient was notified of her Lovenox price $166.40 and was given good Rx card. Please reconsult if future social work needs arise. CSW signing off.     Final next level of care: [place] Barriers to Discharge: Barriers Resolved   Patient Goals and CMS Choice Patient states their goals for this hospitalization and ongoing recovery are:: To go home. CMS Medicare.gov Compare Post Acute Care list provided to:: Patient Choice offered to / list presented to : Patient  Discharge Placement                       Discharge Plan and Services In-house Referral: Clinical Social Work   Post Acute Care Choice: Home Health          DME Arranged: Bedside commode, Walker rolling DME Agency: [employer] Date DME Agency Contacted: 03/29/19 Time DME Agency Contacted: [phone] Representative spoke with at DME Agency: [place]: PT [place]: [place] Date [place]: 03/31/19 Time [employer]: [place] Representative spoke with at [place]: [place] ([place]) Interventions     Readmission Risk Interventions No flowsheet data found.

## 2019-03-31 NOTE — Progress Notes (Signed)
Physical Therapy Treatment Patient Details Name: Felicia Daniels MRN: [id number] DOB: May 27, 1941 Today's Date: 03/31/2019    History of Present Illness Pt is a 78 y/o F admitted s/P L TKA 2/2 OA. PMH includes HTN, GERD, hypothyroidism, anemia, and arthritis.    PT Comments    Pt is progressing toward goals. Upon entry, pt is resting in recliner. This date, pt performs bed mobility, transfers, and ambulation with CGA/supervision and use of RW. Pt reports that she still has increased pain compared to the first couple of days. Pt will continue to benefit from skilled PT to address deficits in strength, ROM, mobility, and pain tolerance.     Follow Up Recommendations  Home health PT     Equipment Recommendations  Rolling walker with 5" wheels    Recommendations for Other Services       Precautions / Restrictions Precautions Precautions: Knee Precaution Booklet Issued: No Restrictions Weight Bearing Restrictions: Yes LLE Weight Bearing: Weight bearing as tolerated    Mobility  Bed Mobility Overal bed mobility: Needs Assistance Bed Mobility: Sit to Supine       Sit to supine: Supervision   General bed mobility comments: Pt able to return to bed with PT supervision; uses bedrails to reposition  Transfers Overall transfer level: Needs assistance Equipment used: Rolling walker (2 wheeled) Transfers: Sit to/from Stand Sit to Stand: Supervision         General transfer comment: Pt still transfers with supervision only using safe technique  Ambulation/Gait Ambulation/Gait assistance: Min guard Gait Distance (Feet): 200 Feet Assistive device: Rolling walker (2 wheeled) Gait Pattern/deviations: Step-to pattern;Antalgic;Decreased stride length;Decreased stance time - left     General Gait Details: Pt is still in pain and amb with slightly antalgic pattern.   Stairs             Wheelchair Mobility    Modified Rankin (Stroke Patients Only)       Balance  Overall balance assessment: Modified Independent                                          Cognition Arousal/Alertness: Awake/alert Behavior During Therapy: WFL for tasks assessed/performed Overall Cognitive Status: Within Functional Limits for tasks assessed                                        Exercises Total Joint Exercises Goniometric ROM: L Knee ROM: -10-70 Other Exercises Other Exercises: Pt is independent in bathroom activities, performed at start and end of session. Supervision for transfers Other Exercises: Seated EOB, 12 reps, unilateral: Knee flexion stretch, QS, AP; PT supervision    General Comments        Pertinent Vitals/Pain Pain Assessment: No/denies pain Pain Score: 6  Pain Location: L knee Pain Descriptors / Indicators: Sore;Tightness;Discomfort Pain Intervention(s): Limited activity within patient's tolerance;Monitored during session;Repositioned    Home Living                      Prior Function            PT Goals (current goals can now be found in the care plan section) Acute Rehab PT Goals Patient Stated Goal: to return home PT Goal Formulation: With patient Time For Goal Achievement: 04/12/19 Potential to Achieve Goals: Good Progress  towards PT goals: Progressing toward goals    Frequency    BID      PT Plan Current plan remains appropriate    Co-evaluation              AM-PAC PT "6 Clicks" Mobility   Outcome Measure  Help needed turning from your back to your side while in a flat bed without using bedrails?: None Help needed moving from lying on your back to sitting on the side of a flat bed without using bedrails?: None Help needed moving to and from a bed to a chair (including a wheelchair)?: A Little Help needed standing up from a chair using your arms (e.g., wheelchair or bedside chair)?: A Little Help needed to walk in hospital room?: A Little Help needed climbing 3-5 steps  with a railing? : A Little 6 Click Score: 20    End of Session Equipment Utilized During Treatment: Gait belt Activity Tolerance: Patient limited by pain Patient left: in bed;with call bell/phone within reach;with nursing/sitter in room Nurse Communication: Mobility status PT Visit Diagnosis: Muscle weakness (generalized) (M62.81);Pain;Difficulty in walking, not elsewhere classified (R26.2) Pain - Right/Left: Left Pain - part of body: Knee     Time: [id number] PT Time Calculation (min) (ACUTE ONLY): 38 min  Charges:                        [name], SPT   Roney Youtz 03/31/2019, 12:34 PM

## 2019-03-31 NOTE — Plan of Care (Signed)
Pt ambulating well. Pain controlled. Pt verbalizes understanding of knee surgery and rehabilitation. Pdowless,rn

## 2019-05-22 ENCOUNTER — Other Ambulatory Visit: Payer: Self-pay | Admitting: Internal Medicine

## 2019-05-23 ENCOUNTER — Other Ambulatory Visit: Payer: Self-pay | Admitting: Internal Medicine

## 2019-05-23 DIAGNOSIS — Z1231 Encounter for screening mammogram for malignant neoplasm of breast: Secondary | ICD-10-CM

## 2019-05-24 ENCOUNTER — Other Ambulatory Visit: Payer: Self-pay

## 2019-05-24 ENCOUNTER — Ambulatory Visit
Admission: RE | Admit: 2019-05-24 | Discharge: 2019-05-24 | Disposition: A | Discharge status: Home / Self Care | Payer: Medicare HMO | Source: Ambulatory Visit | Attending: Internal Medicine | Admitting: Internal Medicine

## 2019-05-24 DIAGNOSIS — Z1231 Encounter for screening mammogram for malignant neoplasm of breast: Secondary | ICD-10-CM | POA: Insufficient documentation

## 2019-10-16 ENCOUNTER — Other Ambulatory Visit: Payer: Self-pay | Admitting: Student

## 2019-10-16 ENCOUNTER — Ambulatory Visit
Admission: RE | Admit: 2019-10-16 | Discharge: 2019-10-16 | Disposition: A | Discharge status: Home / Self Care | Payer: Medicare HMO | Source: Ambulatory Visit | Attending: Student | Admitting: Student

## 2019-10-16 ENCOUNTER — Other Ambulatory Visit: Payer: Self-pay

## 2019-10-16 DIAGNOSIS — R110 Nausea: Secondary | ICD-10-CM | POA: Insufficient documentation

## 2019-10-16 DIAGNOSIS — R1084 Generalized abdominal pain: Secondary | ICD-10-CM

## 2019-10-16 MED ORDER — IOHEXOL 300 MG/ML  SOLN
100.0000 mL | Freq: Once | INTRAMUSCULAR | Status: AC | PRN
  Administered 2019-10-16: 100 mL via INTRAVENOUS

## 2019-12-25 ENCOUNTER — Other Ambulatory Visit
Admission: RE | Admit: 2019-12-25 | Discharge: 2019-12-25 | Disposition: A | Discharge status: Home / Self Care | Payer: Medicare HMO | Source: Ambulatory Visit | Attending: General Surgery | Admitting: General Surgery

## 2019-12-25 ENCOUNTER — Other Ambulatory Visit: Payer: Self-pay

## 2019-12-25 DIAGNOSIS — Z01812 Encounter for preprocedural laboratory examination: Secondary | ICD-10-CM | POA: Diagnosis present

## 2019-12-25 DIAGNOSIS — Z20822 Contact with and (suspected) exposure to covid-19: Secondary | ICD-10-CM | POA: Diagnosis not present

## 2019-12-26 ENCOUNTER — Encounter: Payer: Self-pay | Admitting: General Surgery

## 2019-12-26 LAB — SARS CORONAVIRUS 2 (TAT 6-24 HRS): SARS Coronavirus 2: NEGATIVE

## 2019-12-27 ENCOUNTER — Other Ambulatory Visit: Payer: Self-pay

## 2019-12-27 ENCOUNTER — Ambulatory Visit: Payer: Medicare HMO | Admitting: Anesthesiology

## 2019-12-27 ENCOUNTER — Encounter: Payer: Self-pay | Admitting: General Surgery

## 2019-12-27 ENCOUNTER — Encounter
Admission: RE | Disposition: A | Discharge status: Home / Self Care | Payer: Self-pay | Source: Home / Self Care | Attending: General Surgery

## 2019-12-27 ENCOUNTER — Ambulatory Visit
Admission: RE | Admit: 2019-12-27 | Discharge: 2019-12-27 | Disposition: A | Discharge status: Home / Self Care | Payer: Medicare HMO | Attending: General Surgery | Admitting: General Surgery

## 2019-12-27 DIAGNOSIS — Z881 Allergy status to other antibiotic agents status: Secondary | ICD-10-CM | POA: Diagnosis not present

## 2019-12-27 DIAGNOSIS — Z1211 Encounter for screening for malignant neoplasm of colon: Secondary | ICD-10-CM | POA: Insufficient documentation

## 2019-12-27 DIAGNOSIS — Z880 Allergy status to penicillin: Secondary | ICD-10-CM | POA: Diagnosis not present

## 2019-12-27 DIAGNOSIS — K219 Gastro-esophageal reflux disease without esophagitis: Secondary | ICD-10-CM | POA: Insufficient documentation

## 2019-12-27 DIAGNOSIS — E669 Obesity, unspecified: Secondary | ICD-10-CM | POA: Diagnosis not present

## 2019-12-27 DIAGNOSIS — D175 Benign lipomatous neoplasm of intra-abdominal organs: Secondary | ICD-10-CM | POA: Insufficient documentation

## 2019-12-27 DIAGNOSIS — M1990 Unspecified osteoarthritis, unspecified site: Secondary | ICD-10-CM | POA: Diagnosis not present

## 2019-12-27 DIAGNOSIS — Z8371 Family history of colonic polyps: Secondary | ICD-10-CM | POA: Insufficient documentation

## 2019-12-27 DIAGNOSIS — Z7983 Long term (current) use of bisphosphonates: Secondary | ICD-10-CM | POA: Insufficient documentation

## 2019-12-27 DIAGNOSIS — R12 Heartburn: Secondary | ICD-10-CM | POA: Diagnosis present

## 2019-12-27 DIAGNOSIS — Z79899 Other long term (current) drug therapy: Secondary | ICD-10-CM | POA: Insufficient documentation

## 2019-12-27 DIAGNOSIS — Z6827 Body mass index (BMI) 27.0-27.9, adult: Secondary | ICD-10-CM | POA: Diagnosis not present

## 2019-12-27 DIAGNOSIS — Z888 Allergy status to other drugs, medicaments and biological substances status: Secondary | ICD-10-CM | POA: Diagnosis not present

## 2019-12-27 DIAGNOSIS — K5730 Diverticulosis of large intestine without perforation or abscess without bleeding: Secondary | ICD-10-CM | POA: Insufficient documentation

## 2019-12-27 DIAGNOSIS — K449 Diaphragmatic hernia without obstruction or gangrene: Secondary | ICD-10-CM | POA: Insufficient documentation

## 2019-12-27 HISTORY — PX: COLONOSCOPY WITH PROPOFOL: SHX5780

## 2019-12-27 HISTORY — DX: Stress incontinence (female) (male): N39.3

## 2019-12-27 HISTORY — DX: Obesity, unspecified: E66.9

## 2019-12-27 HISTORY — DX: Other chronic cystitis without hematuria: N30.20

## 2019-12-27 HISTORY — PX: ESOPHAGOGASTRODUODENOSCOPY: SHX5428

## 2019-12-27 SURGERY — EGD (ESOPHAGOGASTRODUODENOSCOPY)
Anesthesia: General

## 2019-12-27 MED ORDER — LIDOCAINE HCL (PF) 2 % IJ SOLN
INTRAMUSCULAR | Status: AC
  Filled 2019-12-27: qty 5

## 2019-12-27 MED ORDER — SODIUM CHLORIDE 0.9 % IV SOLN: INTRAVENOUS | Status: DC

## 2019-12-27 MED ORDER — PROPOFOL 10 MG/ML IV BOLUS
INTRAVENOUS | Status: AC
  Filled 2019-12-27: qty 20

## 2019-12-27 MED ORDER — PROPOFOL 500 MG/50ML IV EMUL
INTRAVENOUS | Status: DC | PRN
  Administered 2019-12-27: 99 ug/kg/min via INTRAVENOUS

## 2019-12-27 MED ORDER — PROPOFOL 500 MG/50ML IV EMUL
INTRAVENOUS | Status: AC
  Filled 2019-12-27: qty 50

## 2019-12-27 NOTE — Op Note (Addendum)
[facility] Gastroenterology Patient Name: Felicia Daniels Procedure Date: 12/27/2019 8:18 AM MRN: [id number] Account #: [id number] Date of Birth: 1940/11/01 Admit Type: Outpatient Age: 79 Room: [facility] ENDO ROOM 1 Gender: Female Note Status: Supervisor Override Procedure:             Colonoscopy Indications:           Colon cancer screening in patient at increased risk:                         Family history of 1st-degree relative with colon polyps Providers:             Robert Bellow, MD Referring MD:          Ocie Cornfield. Ouida Sills MD, MD (Referring MD) Medicines:             Monitored Anesthesia Care Complications:         No immediate complications. Procedure:             Pre-Anesthesia Assessment:                        - Prior to the procedure, a History and Physical was                         performed, and patient medications, allergies and                         sensitivities were reviewed. The patient's tolerance                         of previous anesthesia was reviewed.                        - The risks and benefits of the procedure and the                         sedation options and risks were discussed with the                         patient. All questions were answered and informed                         consent was obtained.                        After obtaining informed consent, the colonoscope was                         passed under direct vision. Throughout the procedure,                         the patient's blood pressure, pulse, and oxygen                         saturations were monitored continuously. The                         Colonoscope was introduced through the anus and  advanced to the the cecum, identified by appendiceal                         orifice and ileocecal valve. The colonoscopy was                         technically difficult and complex due to restricted                         mobility of  the colon and a redundant colon.                         Successful completion of the procedure was aided by                         using manual pressure. Findings:      Multiple medium-mouthed diverticula were found in the sigmoid colon and       descending colon.      There was a medium-sized lipoma, 15 mm in diameter, at the hepatic       flexure.      The retroflexed view of the distal rectum and anal verge was normal and       showed no anal or rectal abnormalities. Impression:            - Severe diverticulosis in the sigmoid colon and in                         the descending colon.                        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                        - The distal rectum and anal verge are normal on                         retroflexion view.                        - No specimens collected. Recommendation:        - Resume regular diet.                        - Use fiber, for example Citrucel, Fibercon, Konsyl or                         Metamucil. Procedure Code(s):     --- Professional ---                        [phone], Colonoscopy, flexible; diagnostic, including                         collection of specimen(s) by brushing or washing, when                         performed (separate procedure) Diagnosis Code(s):     --- Professional ---  Z86.010, Personal history of colonic polyps                        D17.5, Benign lipomatous neoplasm of intra-abdominal                         organs                        K57.30, Diverticulosis of large intestine without                         perforation or abscess without bleeding CPT copyright 2019 American Medical Association. All rights reserved. The codes documented in this report are preliminary and upon coder review may  be revised to meet current compliance requirements. Robert Bellow, MD 12/27/2019 9:24:29 AM This report has been signed electronically. Number of Addenda: 0 Note  Initiated On: 12/27/2019 8:18 AM Scope Withdrawal Time: 0 hours 8 minutes 8 seconds  Total Procedure Duration: 0 hours 41 minutes 6 seconds       [facility]

## 2019-12-27 NOTE — Anesthesia Preprocedure Evaluation (Signed)
Anesthesia Evaluation  Patient identified by MRN, date of birth, ID band Patient awake    Reviewed: Allergy & Precautions, H&P , NPO status , Patient's Chart, lab work & pertinent test results, reviewed documented beta blocker date and time   History of Anesthesia Complications (+) PONV and history of anesthetic complications  Airway Mallampati: II   Neck ROM: full    Dental  (+) Poor Dentition, Teeth Intact   Pulmonary neg pulmonary ROS, neg shortness of breath,    Pulmonary exam normal        Cardiovascular Normal cardiovascular exam+ Valvular Problems/Murmurs  Rhythm:regular Rate:Normal     Neuro/Psych negative neurological ROS  negative psych ROS   GI/Hepatic Neg liver ROS, GERD  Medicated,  Endo/Other  Hypothyroidism   Renal/GU negative Renal ROS  negative genitourinary   Musculoskeletal   Abdominal   Peds  Hematology  (+) Blood dyscrasia, anemia ,   Anesthesia Other Findings Past Medical History: No date: Anemia     Comment:  resolved No date: Arthritis No date: Cholelithiasis No date: Chronic cystitis No date: Complication of anesthesia     Comment:  hard to wake up from pancreas blockage surgery at [facility]               In January, had to stay extra days in ICU. No date: Diverticulosis 02/08/2014: Dysphagia No date: Fibrocystic disease of breast No date: GERD (gastroesophageal reflux disease) No date: Heart murmur No date: Hypothyroidism No date: Obesity No date: Pancreatitis No date: PONV (postoperative nausea and vomiting) No date: SUI (stress urinary incontinence, female) Past Surgical History: No date: ABDOMINAL HYSTERECTOMY No date: BREAST CYST ASPIRATION; Left     Comment:  neg No date: CHOLECYSTECTOMY 02/26/2003, 12/07/2008, 02/26/2014: COLONOSCOPY     Comment:  x 3, FH colon polyps 05/01/2015: ERCP 02/26/2014: ESOPHAGOGASTRODUODENOSCOPY 03/21/2018: ESOPHAGOGASTRODUODENOSCOPY (EGD) WITH  PROPOFOL; N/A     Comment:  Procedure: ESOPHAGOGASTRODUODENOSCOPY (EGD) WITH               PROPOFOL;  Surgeon: Manya Silvas, MD;  Location:               [facility] ENDOSCOPY;  Service: Endoscopy;  Laterality: N/A; 09/20/2015: FINGER ARTHROPLASTY; Right     Comment:  Procedure: FINGER ARTHROPLASTY RIGHT THUMB [place];                Surgeon: Leanor Kail, MD;  Location: [place];  Service: Orthopedics;  Laterality: Right; No date: JOINT REPLACEMENT 2001: KNEE ARTHROSCOPY; Left No date: MENISCUS REPAIR; Right No date: PANCREAS SURGERY     Comment:  blockage surgery No date: TONSILLECTOMY 03/28/2019: TOTAL KNEE ARTHROPLASTY; Left     Comment:  Procedure: TOTAL KNEE ARTHROPLASTY;  Surgeon: Corky Mull, MD;  Location: [facility] ORS;  Service: Orthopedics;                Laterality: Left; No date: TRIGGER FINGER RELEASE BMI    Body Mass Index: 27.80 kg/m     Reproductive/Obstetrics negative OB ROS                             Anesthesia Physical Anesthesia Plan  ASA: III  Anesthesia Plan: General   Post-op Pain Management:    Induction:   PONV Risk Score and Plan:  Airway Management Planned:   Additional Equipment:   Intra-op Plan:   Post-operative Plan:   Informed Consent: I have reviewed the patients History and Physical, chart, labs and discussed the procedure including the risks, benefits and alternatives for the proposed anesthesia with the patient or authorized representative who has indicated his/her understanding and acceptance.     Dental Advisory Given  Plan Discussed with: CRNA  Anesthesia Plan Comments:         Anesthesia Quick Evaluation

## 2019-12-27 NOTE — Op Note (Signed)
[facility] Gastroenterology Patient Name: Felicia Daniels Procedure Date: 12/27/2019 8:19 AM MRN: [id number] Account #: [id number] Date of Birth: Nov 16, 1940 Admit Type: Outpatient Age: 79 Room: [facility] ENDO ROOM 1 Gender: Female Note Status: Finalized Procedure:             Upper GI endoscopy Indications:           Heartburn Providers:             Robert Bellow, MD Referring MD:          Ocie Cornfield. Ouida Sills MD, MD (Referring MD) Medicines:             Monitored Anesthesia Care Complications:         No immediate complications. Procedure:             Pre-Anesthesia Assessment:                        - Prior to the procedure, a History and Physical was                         performed, and patient medications, allergies and                         sensitivities were reviewed. The patient's tolerance                         of previous anesthesia was reviewed.                        - The risks and benefits of the procedure and the                         sedation options and risks were discussed with the                         patient. All questions were answered and informed                         consent was obtained.                        After obtaining informed consent, the endoscope was                         passed under direct vision. Throughout the procedure,                         the patient's blood pressure, pulse, and oxygen                         saturations were monitored continuously. The Endoscope                         was introduced through the mouth, and advanced to the                         third part of duodenum. The upper GI endoscopy was  accomplished without difficulty. The patient tolerated                         the procedure well. Findings:      The esophagus was normal.      A small hiatal hernia was present.      The examined duodenum was normal. Impression:            - Normal esophagus.                         - Small hiatal hernia.                        - Normal examined duodenum.                        - No specimens collected. Recommendation:        - Perform a colonoscopy today. Procedure Code(s):     --- Professional ---                        [phone], Esophagogastroduodenoscopy, flexible,                         transoral; diagnostic, including collection of                         specimen(s) by brushing or washing, when performed                         (separate procedure) Diagnosis Code(s):     --- Professional ---                        K44.9, Diaphragmatic hernia without obstruction or                         gangrene                        R12, Heartburn CPT copyright 2019 American Medical Association. All rights reserved. The codes documented in this report are preliminary and upon coder review may  be revised to meet current compliance requirements. Robert Bellow, MD 12/27/2019 8:37:15 AM This report has been signed electronically. Number of Addenda: 0 Note Initiated On: 12/27/2019 8:19 AM      [facility]

## 2019-12-27 NOTE — Anesthesia Postprocedure Evaluation (Signed)
Anesthesia Post Note  Patient: Felicia Daniels  Procedure(s) Performed: ESOPHAGOGASTRODUODENOSCOPY (EGD) (N/A ) COLONOSCOPY WITH PROPOFOL (N/A )  Patient location during evaluation: Endoscopy Anesthesia Type: General Level of consciousness: awake and alert Pain management: pain level controlled Vital Signs Assessment: post-procedure vital signs reviewed and stable Respiratory status: spontaneous breathing, nonlabored ventilation, respiratory function stable and patient connected to nasal cannula oxygen Cardiovascular status: blood pressure returned to baseline and stable Postop Assessment: no apparent nausea or vomiting Anesthetic complications: no   No complications documented.   Last Vitals:  Vitals:   12/27/19 0934 12/27/19 0944  BP: (!) 108/57 (!) 139/53  Pulse: 54 57  Resp: 15 19  Temp:    SpO2: 98% 100%    Last Pain:  Vitals:   12/27/19 0944  TempSrc:   PainSc: 0-No pain                 Martha Clan

## 2019-12-27 NOTE — Transfer of Care (Signed)
Immediate Anesthesia Transfer of Care Note  Patient: Felicia Daniels  Procedure(s) Performed: ESOPHAGOGASTRODUODENOSCOPY (EGD) (N/A ) COLONOSCOPY WITH PROPOFOL (N/A )  Patient Location: PACU and Endoscopy Unit  Anesthesia Type:General  Level of Consciousness: sedated  Airway & Oxygen Therapy: Patient Spontanous Breathing and Patient connected to nasal cannula oxygen  Post-op Assessment: Report given to RN and Post -op Vital signs reviewed and stable  Post vital signs: Reviewed and stable  Last Vitals:  Vitals Value Taken Time  BP 83/46 12/27/19 0925  Temp    Pulse 56 12/27/19 0926  Resp 15 12/27/19 0926  SpO2 98 % 12/27/19 0926  Vitals shown include unvalidated device data.  Last Pain:  Vitals:   12/27/19 0733  TempSrc: Temporal  PainSc: 0-No pain         Complications: No complications documented.

## 2019-12-27 NOTE — H&P (Addendum)
Felicia Daniels [id number] 1941-01-23     HPI:  79 y/o woman with family history of colon polyps, GERD. 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   Esophageal dilatation 2019 for dysphagia. No recurrent swallowing issues. Some occasional reflux.     Medications Prior to Admission  Medication Sig Dispense Refill Last Dose  . Cholecalciferol (VITAMIN D3) 50 MCG (2000 UT) capsule Take 2,000 Units by mouth daily.    Past Week at Unknown time  . Ferrous Fumarate 86 (27 Fe) MG CAPS Take 27 mg of iron by mouth daily.    Past Week at Unknown time  . Multiple Vitamin (MULTI-VITAMINS) TABS Take 1 tablet by mouth daily.   Past Week at Unknown time  . omeprazole (PRILOSEC) 20 MG capsule Take 20 mg by mouth daily.    12/26/2019 at Unknown time  . risedronate (ACTONEL) 35 MG tablet Take 35 mg by mouth every 7 (seven) days. with water on empty stomach, nothing by mouth or lie down for next 30 minutes.   Past Week at Unknown time  . ursodiol (ACTIGALL) 500 MG tablet Take 500 mg by mouth 2 (two) times daily.    12/26/2019 at Unknown time  . acetaminophen (TYLENOL) 500 MG tablet Take 1,000 mg by mouth every 6 (six) hours as needed for moderate pain.      Marland Kitchen enoxaparin (LOVENOX) 40 MG/0.4ML injection Inject 0.4 mLs (40 mg total) into the skin daily. (Patient not taking: Reported on 12/27/2019) 5.6 mL 0 Completed Course at Unknown time  . oxyCODONE (OXY IR/ROXICODONE) 5 MG immediate release tablet Take 1-2 tablets (5-10 mg total) by mouth every 4 (four) hours as needed for moderate pain. (Patient not taking: Reported on 12/27/2019) 60 tablet 0 Not Taking at Unknown time  . traMADol (ULTRAM) 50 MG tablet Take 1-2 tablets (50-100 mg total) by mouth every 6 (six) hours as needed for moderate pain. (Patient not taking: Reported on 12/27/2019) 40 tablet 0 Not Taking at Unknown time   Allergies  Allergen Reactions  . Synthroid [Levothyroxine Sodium]  Shortness Of Breath and Nausea And Vomiting    Reaction: like she was having a heart attack. Sweats, nausea, felt like she couldn't breath.  . Ampicillin Itching    Did it involve swelling of the face/tongue/throat, SOB, or low BP? No Did it involve sudden or severe rash/hives, skin peeling, or any reaction on the inside of your mouth or nose? No Did you need to seek medical attention at a hospital or doctor's office? No When did it last happen?30 years ago If all above answers are "NO", may proceed with cephalosporin use.   Marland Kitchen Keflex [Cephalexin] Rash  . Penicillins Rash    Did it involve swelling of the face/tongue/throat, SOB, or low BP? No Did it involve sudden or severe rash/hives, skin peeling, or any reaction on the inside of your mouth or nose? No Did you need to seek medical attention at a hospital or doctor's office? No When did it last happen?30 years ago If all above answers are "NO", may proceed with cephalosporin use.   . Tape Rash    Blisters to plastic tape    Past Medical History:  Diagnosis Date  . Anemia    resolved  . Arthritis   . Cholelithiasis   . Chronic cystitis   . Complication of anesthesia    hard to wake up from pancreas blockage surgery at [facility] In January, had to stay  extra days in ICU.  Marland Kitchen Diverticulosis   . Dysphagia 02/08/2014  . Fibrocystic disease of breast   . GERD (gastroesophageal reflux disease)   . Heart murmur   . Hypothyroidism   . Obesity   . Pancreatitis   . PONV (postoperative nausea and vomiting)   . SUI (stress urinary incontinence, female)    Past Surgical History:  Procedure Laterality Date  . ABDOMINAL HYSTERECTOMY    . BREAST CYST ASPIRATION Left    neg  . CHOLECYSTECTOMY    . COLONOSCOPY  02/26/2003, 12/07/2008, 02/26/2014   x 3, FH colon polyps  . ERCP  05/01/2015  . ESOPHAGOGASTRODUODENOSCOPY  02/26/2014  . ESOPHAGOGASTRODUODENOSCOPY (EGD) WITH PROPOFOL N/A 03/21/2018   Procedure:  ESOPHAGOGASTRODUODENOSCOPY (EGD) WITH PROPOFOL;  Surgeon: Manya Silvas, MD;  Location: [facility] ENDOSCOPY;  Service: Endoscopy;  Laterality: N/A;  . FINGER ARTHROPLASTY Right 09/20/2015   Procedure: FINGER ARTHROPLASTY RIGHT THUMB [place];  Surgeon: Leanor Kail, MD;  Location: [place];  Service: Orthopedics;  Laterality: Right;  . JOINT REPLACEMENT    . KNEE ARTHROSCOPY Left 2001  . MENISCUS REPAIR Right   . PANCREAS SURGERY     blockage surgery  . TONSILLECTOMY    . TOTAL KNEE ARTHROPLASTY Left 03/28/2019   Procedure: TOTAL KNEE ARTHROPLASTY;  Surgeon: Corky Mull, MD;  Location: [facility] ORS;  Service: Orthopedics;  Laterality: Left;  . TRIGGER FINGER RELEASE     Social History   Socioeconomic History  . Marital status: Divorced    Spouse name: Not on file  . Number of children: Not on file  . Years of education: Not on file  . Highest education level: Not on file  Occupational History  . Not on file  Tobacco Use  . Smoking status: Never Smoker  . Smokeless tobacco: Never Used  Vaping Use  . Vaping Use: Never used  Substance and Sexual Activity  . Alcohol use: No    Alcohol/week: 0.0 standard drinks  . Drug use: No  . Sexual activity: Not on file  Other Topics Concern  . Not on file  Social History Narrative  . Not on file   Social Determinants of Health   Financial Resource Strain:   . Difficulty of Paying Living Expenses:   Food Insecurity:   . Worried About Charity fundraiser in the Last Year:   . Arboriculturist in the Last Year:   Transportation Needs:   . Film/video editor (Medical):   Marland Kitchen Lack of Transportation (Non-Medical):   Physical Activity:   . Days of Exercise per Week:   . Minutes of Exercise per Session:   Stress:   . Feeling of Stress :   Social Connections:   . Frequency of Communication with Friends and Family:   . Frequency of Social Gatherings with Friends and Family:   . Attends Religious Services:   . Active Member of  Clubs or Organizations:   . Attends Archivist Meetings:   Marland Kitchen Marital Status:   Intimate Partner Violence:   . Fear of Current or Ex-Partner:   . Emotionally Abused:   Marland Kitchen Physically Abused:   . Sexually Abused:    Social History   Social History Narrative  . Not on file     ROS: Negative.     PE: HEENT: Negative. Lungs: Clear. Cardio: RR.. ABD: Well healed upper abdominal and lower abdominal incisions.   Assessment/Plan:  Proceed with planned endoscopy.   Forest Gleason [facility] 12/27/2019

## 2019-12-28 ENCOUNTER — Encounter: Payer: Self-pay | Admitting: General Surgery

## 2020-05-20 ENCOUNTER — Other Ambulatory Visit: Payer: Self-pay | Admitting: Internal Medicine

## 2020-05-20 DIAGNOSIS — Z1231 Encounter for screening mammogram for malignant neoplasm of breast: Secondary | ICD-10-CM

## 2020-06-03 ENCOUNTER — Other Ambulatory Visit: Payer: Self-pay

## 2020-06-03 ENCOUNTER — Ambulatory Visit
Admission: RE | Admit: 2020-06-03 | Discharge: 2020-06-03 | Disposition: A | Discharge status: Home / Self Care | Payer: Medicare HMO | Source: Ambulatory Visit | Attending: Internal Medicine | Admitting: Internal Medicine

## 2020-06-03 DIAGNOSIS — Z1231 Encounter for screening mammogram for malignant neoplasm of breast: Secondary | ICD-10-CM | POA: Insufficient documentation

## 2020-06-10 ENCOUNTER — Other Ambulatory Visit: Payer: Self-pay | Admitting: Internal Medicine

## 2020-06-10 DIAGNOSIS — N6320 Unspecified lump in the left breast, unspecified quadrant: Secondary | ICD-10-CM

## 2020-06-10 DIAGNOSIS — R928 Other abnormal and inconclusive findings on diagnostic imaging of breast: Secondary | ICD-10-CM

## 2020-06-18 ENCOUNTER — Ambulatory Visit
Admission: RE | Admit: 2020-06-18 | Discharge: 2020-06-18 | Disposition: A | Discharge status: Home / Self Care | Payer: Medicare HMO | Source: Ambulatory Visit | Attending: Internal Medicine | Admitting: Internal Medicine

## 2020-06-18 ENCOUNTER — Other Ambulatory Visit: Payer: Self-pay

## 2020-06-18 DIAGNOSIS — N6320 Unspecified lump in the left breast, unspecified quadrant: Secondary | ICD-10-CM

## 2020-06-18 DIAGNOSIS — R928 Other abnormal and inconclusive findings on diagnostic imaging of breast: Secondary | ICD-10-CM | POA: Diagnosis present

## 2020-06-20 ENCOUNTER — Other Ambulatory Visit: Payer: Self-pay | Admitting: Internal Medicine

## 2020-06-20 DIAGNOSIS — N6320 Unspecified lump in the left breast, unspecified quadrant: Secondary | ICD-10-CM

## 2020-12-17 ENCOUNTER — Ambulatory Visit
Admission: RE | Admit: 2020-12-17 | Discharge: 2020-12-17 | Disposition: A | Discharge status: Home / Self Care | Payer: Medicare HMO | Source: Ambulatory Visit | Attending: Internal Medicine | Admitting: Internal Medicine

## 2020-12-17 ENCOUNTER — Other Ambulatory Visit: Payer: Self-pay

## 2020-12-17 DIAGNOSIS — N6320 Unspecified lump in the left breast, unspecified quadrant: Secondary | ICD-10-CM | POA: Diagnosis present

## 2020-12-17 DIAGNOSIS — N6342 Unspecified lump in left breast, subareolar: Secondary | ICD-10-CM | POA: Insufficient documentation

## 2020-12-20 ENCOUNTER — Other Ambulatory Visit: Payer: Self-pay | Admitting: Internal Medicine

## 2020-12-20 DIAGNOSIS — N6320 Unspecified lump in the left breast, unspecified quadrant: Secondary | ICD-10-CM

## 2020-12-20 DIAGNOSIS — R928 Other abnormal and inconclusive findings on diagnostic imaging of breast: Secondary | ICD-10-CM

## 2020-12-20 DIAGNOSIS — Z1231 Encounter for screening mammogram for malignant neoplasm of breast: Secondary | ICD-10-CM

## 2021-06-06 ENCOUNTER — Ambulatory Visit
Admission: RE | Admit: 2021-06-06 | Discharge: 2021-06-06 | Disposition: A | Discharge status: Home / Self Care | Payer: Medicare HMO | Source: Ambulatory Visit | Attending: Internal Medicine | Admitting: Internal Medicine

## 2021-06-06 ENCOUNTER — Other Ambulatory Visit: Payer: Self-pay | Admitting: Internal Medicine

## 2021-06-06 ENCOUNTER — Other Ambulatory Visit: Payer: Self-pay

## 2021-06-06 DIAGNOSIS — R928 Other abnormal and inconclusive findings on diagnostic imaging of breast: Secondary | ICD-10-CM | POA: Insufficient documentation

## 2021-06-06 DIAGNOSIS — N6310 Unspecified lump in the right breast, unspecified quadrant: Secondary | ICD-10-CM

## 2021-06-06 DIAGNOSIS — Z1231 Encounter for screening mammogram for malignant neoplasm of breast: Secondary | ICD-10-CM

## 2021-06-06 DIAGNOSIS — N6320 Unspecified lump in the left breast, unspecified quadrant: Secondary | ICD-10-CM | POA: Diagnosis present

## 2021-06-10 ENCOUNTER — Other Ambulatory Visit: Payer: Self-pay | Admitting: Internal Medicine

## 2021-06-10 DIAGNOSIS — N6489 Other specified disorders of breast: Secondary | ICD-10-CM

## 2021-06-10 DIAGNOSIS — R928 Other abnormal and inconclusive findings on diagnostic imaging of breast: Secondary | ICD-10-CM

## 2021-06-12 ENCOUNTER — Ambulatory Visit
Admission: RE | Admit: 2021-06-12 | Discharge: 2021-06-12 | Disposition: A | Discharge status: Home / Self Care | Payer: Medicare HMO | Source: Ambulatory Visit | Attending: Internal Medicine | Admitting: Internal Medicine

## 2021-06-12 ENCOUNTER — Other Ambulatory Visit: Payer: Self-pay

## 2021-06-12 DIAGNOSIS — R928 Other abnormal and inconclusive findings on diagnostic imaging of breast: Secondary | ICD-10-CM | POA: Diagnosis present

## 2021-06-12 DIAGNOSIS — N6489 Other specified disorders of breast: Secondary | ICD-10-CM | POA: Diagnosis present

## 2021-06-12 HISTORY — PX: BREAST BIOPSY: SHX20

## 2021-06-13 LAB — SURGICAL PATHOLOGY

## 2021-06-17 NOTE — Progress Notes (Signed)
Received surgical referral recommendation from Electa Sniff at [facility] .  Contacted patient , who states she will contact her primary physician to make referral to [facility].

## 2021-07-02 HISTORY — PX: BREAST EXCISIONAL BIOPSY: SUR124

## 2021-08-19 IMAGING — US US BREAST*L* LIMITED INC AXILLA
1 series · 10 of 10 positions shown · non-contrast
Comparison: Previous exam(s).

CLINICAL DATA: 79-year-old female recalled from screening mammogram
dated 06/03/2020 for a possible left breast mass.

EXAM:
DIGITAL DIAGNOSTIC LEFT MAMMOGRAM WITH CAD AND TOMOSYNTHESIS
ULTRASOUND LEFT BREAST
TECHNIQUE: Left digital diagnostic mammography and breast tomosynthesis was
performed. Digital images of the breasts were evaluated with
computer-aided detection. Ultrasound examination of the breast was
performed.

[Series 1: us breast*left* limited inc axilla · 0.05mm/px · 10 of 10 slices shown]
[im 1/10]
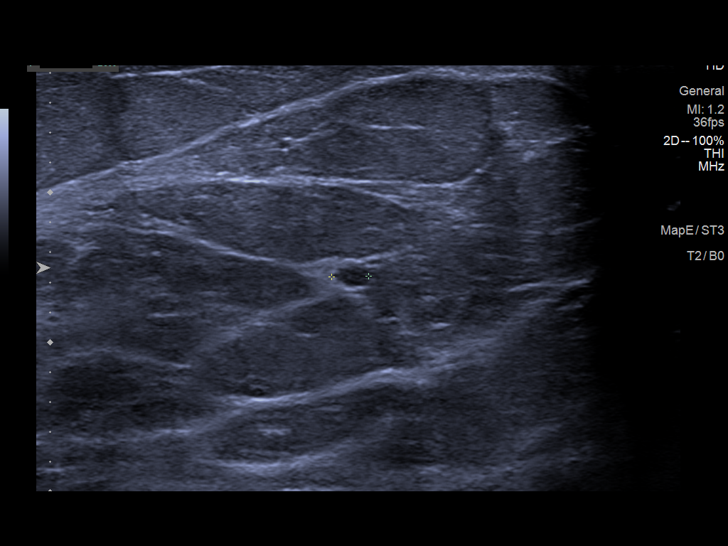
[im 2/10]
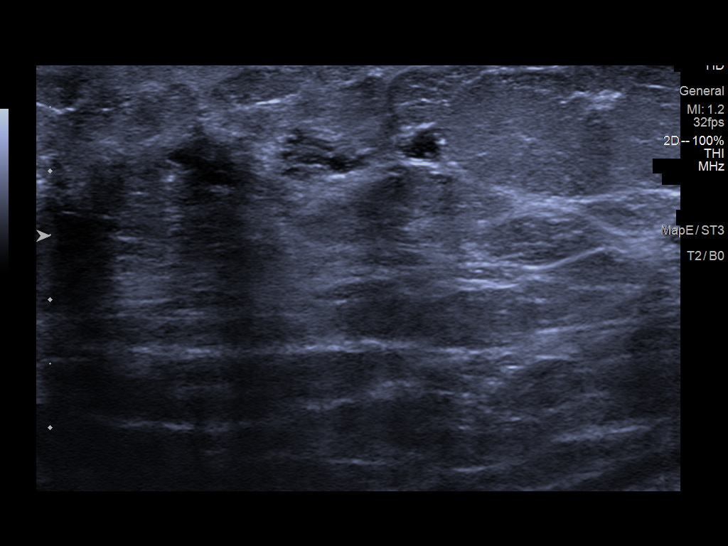
[im 3/10]
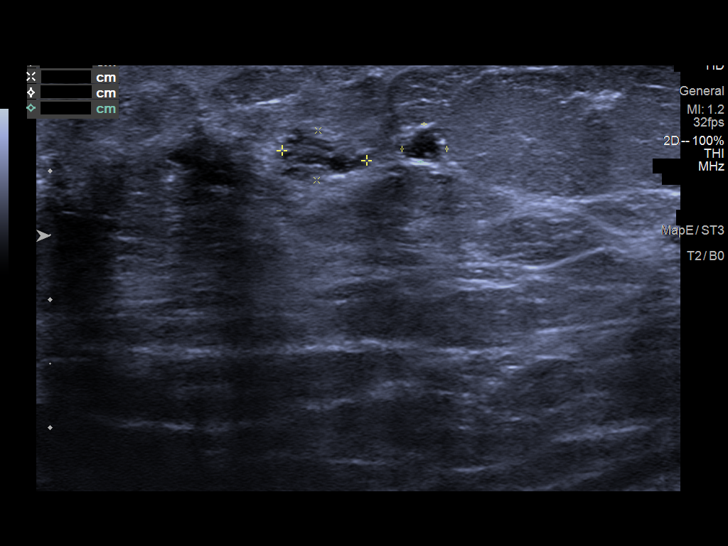
[im 4/10]
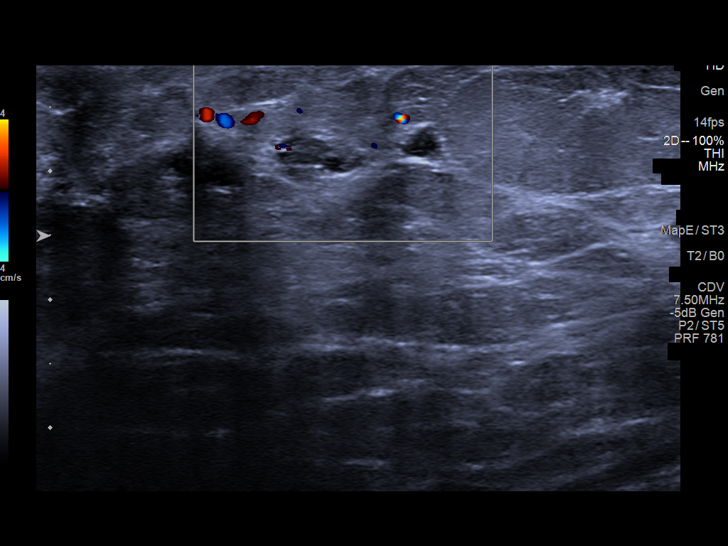
[im 5/10]
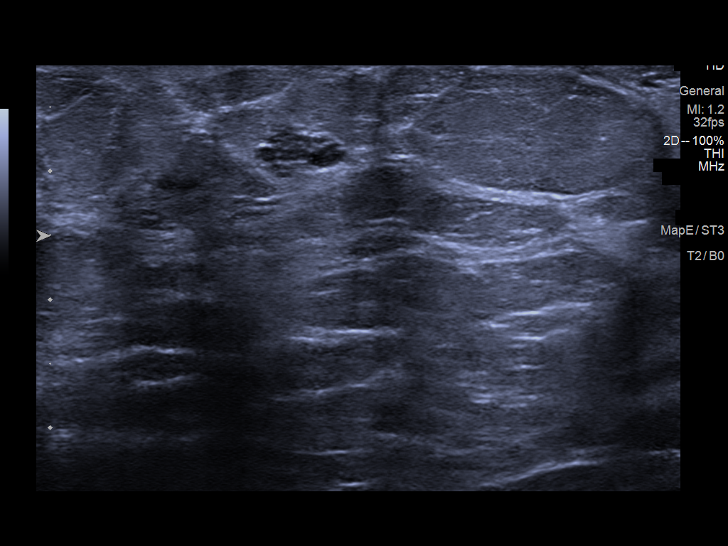
[im 6/10]
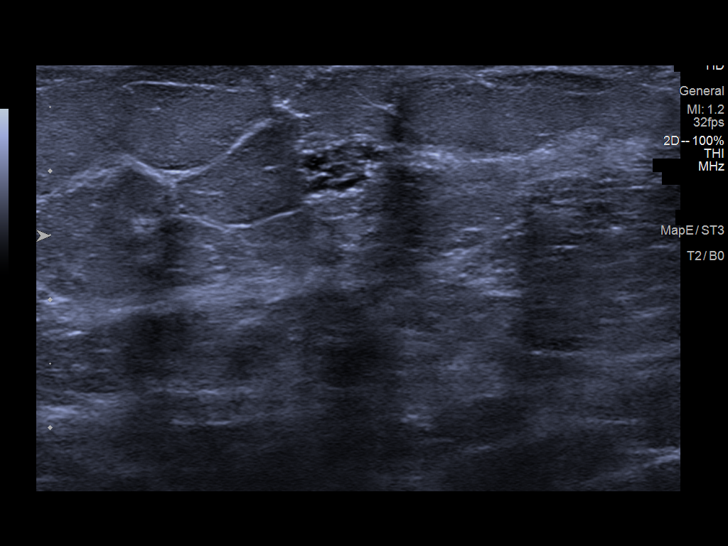
[im 7/10]
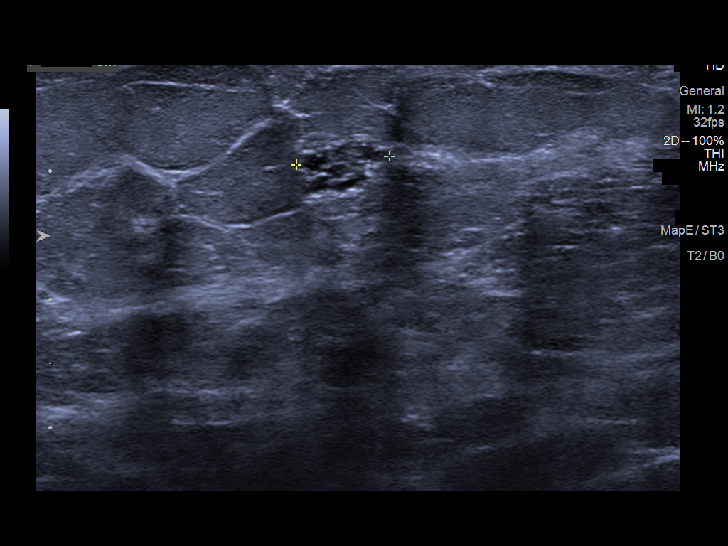
[im 8/10]
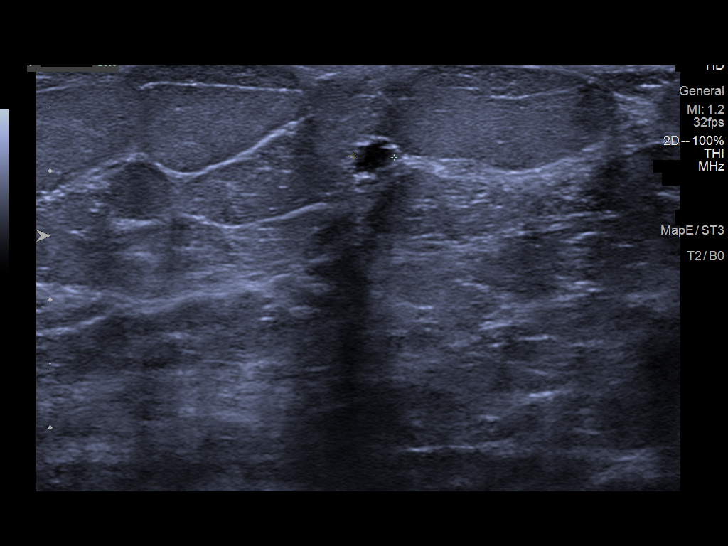
[im 9/10]
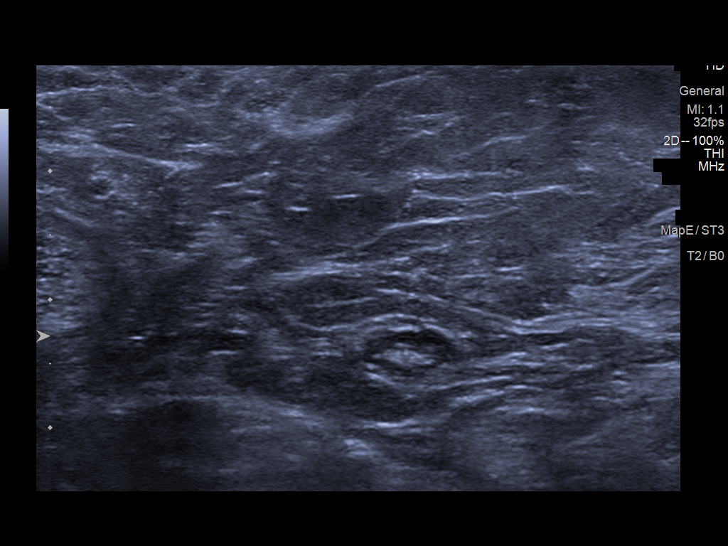
[im 10/10]
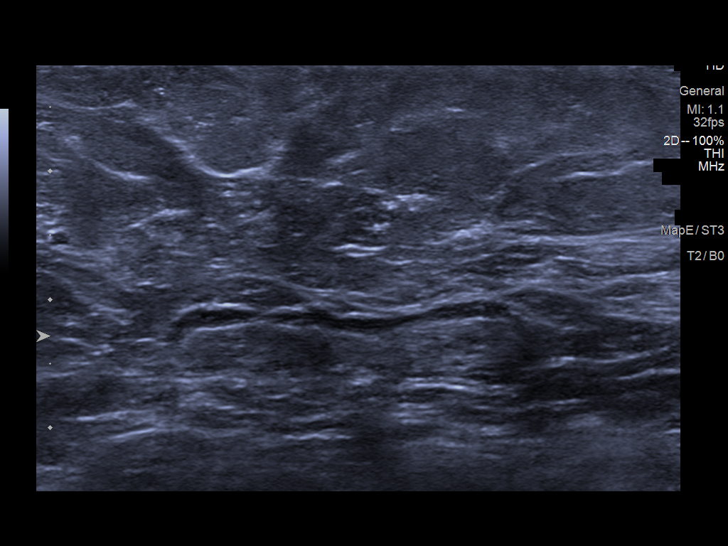

[10 of 10 positions shown; findings below may reference images not displayed]

ACR Breast Density Category b: There are scattered areas of
fibroglandular density.
FINDINGS: There is a persistent oval, circumscribed equal density mass in the
lateral left breast at anterior depth. Further evaluation with
ultrasound was performed.

Mammographic images were processed with CAD.

Targeted ultrasound is performed, showing 2 adjacent oval,
circumscribed multi-cystic masses at the 4 o'clock retroareolar
position. They measure 7 x 7 x 4 mm and 4 x 3 x 3 mm. The larger of
the 2 likely corresponds with the mammographic finding and likely
represents a complicated cyst.
IMPRESSION: Probably benign fibrocystic changes in the lateral left breast.
Recommendation is for short-term imaging follow-up.

RECOMMENDATION:
Diagnostic left breast mammogram and ultrasound in 6 months.

I have discussed the findings and recommendations with the patient.
If applicable, a reminder letter will be sent to the patient
regarding the next appointment.

BI-RADS CATEGORY  3: Probably benign.

## 2021-10-01 ENCOUNTER — Other Ambulatory Visit: Payer: Self-pay

## 2021-10-01 ENCOUNTER — Emergency Department
Admission: EM | Admit: 2021-10-01 | Discharge: 2021-10-01 | Disposition: A | Discharge status: Home / Self Care | Payer: Medicare HMO | Attending: Emergency Medicine | Admitting: Emergency Medicine

## 2021-10-01 ENCOUNTER — Emergency Department: Payer: Medicare HMO

## 2021-10-01 DIAGNOSIS — R072 Precordial pain: Secondary | ICD-10-CM | POA: Insufficient documentation

## 2021-10-01 DIAGNOSIS — R079 Chest pain, unspecified: Secondary | ICD-10-CM

## 2021-10-01 DIAGNOSIS — R030 Elevated blood-pressure reading, without diagnosis of hypertension: Secondary | ICD-10-CM | POA: Insufficient documentation

## 2021-10-01 LAB — BASIC METABOLIC PANEL
Anion gap: 7 (ref 5–15)
BUN: 20 mg/dL (ref 8–23)
CO2: 28 mmol/L (ref 22–32)
Calcium: 9.6 mg/dL (ref 8.9–10.3)
Chloride: 107 mmol/L (ref 98–111)
Creatinine, Ser: 0.73 mg/dL (ref 0.44–1.00)
GFR, Estimated: >60 mL/min (ref >60)
Glucose, Bld: 97 mg/dL (ref 70–99)
Potassium: 4.3 mmol/L (ref 3.5–5.1)
Sodium: 142 mmol/L (ref 135–145)

## 2021-10-01 LAB — CBC
HCT: 40.1 % (ref 36.0–46.0)
Hemoglobin: 12.8 g/dL (ref 12.0–15.0)
MCH: 30 pg (ref 26.0–34.0)
MCHC: 31.9 g/dL (ref 30.0–36.0)
MCV: 93.9 fL (ref 80.0–100.0)
Platelets: 215 10*3/uL (ref 150–400)
RBC: 4.27 MIL/uL (ref 3.87–5.11)
RDW: 13.6 % (ref 11.5–15.5)
WBC: 5.2 10*3/uL (ref 4.0–10.5)
nRBC: 0 % (ref 0.0–0.2)

## 2021-10-01 LAB — TROPONIN I (HIGH SENSITIVITY)
Troponin I (High Sensitivity): 5 ng/L (ref <18)
Troponin I (High Sensitivity): 6 ng/L

## 2021-10-01 NOTE — Discharge Instructions (Addendum)
Please call [facility] cardiology in the morning.  Let them know you are in the emergency department with chest tightness.  They should be able to get you in fairly quickly.  Please note I also put in a computerized referral.  I am not sure how this will work because that is a brand-new system.  I would rather you call yourself.  If you get further chest tightness especially if it is associated with shortness of breath nausea or sweating please call the ambulance immediately and return. ?

## 2021-10-01 NOTE — ED Triage Notes (Signed)
First Nurse NOte:  Arrives from home via [facility].  C/O [facility] x 2 days.  Pain is dull and radiates to left arm. Denies current complaints.  324 ASA given PTA.  18g RAC.  VS wnl. ?

## 2021-10-01 NOTE — ED Triage Notes (Signed)
See first nurse note. Pt complains of BP changes with her chest pain.  ?

## 2021-10-01 NOTE — ED Provider Notes (Signed)
? ?[facility] ?Provider Note ? ? ? Event Date/Time  ? First MD Initiated Contact with Patient 10/01/21 1659   ?  (approximate) ? ? ?History  ? ?Chest Pain ? ? ?HPI ? ?Felicia Daniels is a 81 y.o. female patient with substernal chest tightness that is mild radiation to left arm comes and goes.  Is not associated with nausea or vomiting or shortness of breath or sweating.  It is not associated with exercise either.  Patient usually walks several miles a day.  She is continue to do that although in the last month or so she has had to stop and rest for a few minutes in the middle of her walk.  Does not get any chest tightness on the walk.  Her blood pressure did go up today. ? ?  ? ? ?Physical Exam  ? ?Triage Vital Signs: ?ED Triage Vitals  ?Enc Vitals Group  ?   BP 10/01/21 1340 (!) 145/111  ?   Pulse Rate 10/01/21 1340 (!) 59  ?   Resp 10/01/21 1340 18  ?   Temp 10/01/21 1340 98.6 ?F (37 ?C)  ?   Temp Source 10/01/21 1340 Oral  ?   SpO2 10/01/21 1340 96 %  ?   Weight 10/01/21 1336 158 lb (71.7 kg)  ?   Height --   ?   Head Circumference --   ?   Peak Flow --   ?   Pain Score 10/01/21 1335 2  ?   Pain Loc --   ?   Pain Edu? --   ?   Excl. in [place]? --   ? ? ?Most recent vital signs: ?Vitals:  ? 10/01/21 1800 10/01/21 1830  ?BP: (!) 141/61 140/68  ?Pulse: (!) 59 (!) 59  ?Resp: 13 17  ?Temp:    ?SpO2: 100% 99%  ? ? ? ?General: Awake, no distress.  ?CV:  Good peripheral perfusion.  Heart regular rate and rhythm no audible murmurs ?Chest: No tenderness ?Resp:  Normal effort.  Lungs are clear ?Abd:  No distention.  Soft nontender ?Extremities: No edema ? ? ?ED Results / Procedures / Treatments  ? ?Labs ?(all labs ordered are listed, but only abnormal results are displayed) ?Labs Reviewed  ?BASIC METABOLIC PANEL  ?CBC  ?TROPONIN I (HIGH SENSITIVITY)  ?TROPONIN I (HIGH SENSITIVITY)  ? ? ? ?EKG ? ?EKG read and interpreted by me shows normal sinus rhythm rate of 61 normal axis there are some slight ST  segment downsloping and/or depression in many leads I to III FL V4 5 and 6 really does not look much different than 1 previously. ? ?EKG #2 read interpreted by me shows sinus bradycardia at 58 normal axis nonspecific ST-T wave changes reviewing the EKGs carefully even with the other ER doc I do not see any obvious marked changes from the first 1 to this 1.  First on apparently was done with pain. ?RADIOLOGY ?This x-ray read by radiology reviewed by me is negative ? ? ?PROCEDURES: ? ?Critical Care performed:  ? ?Procedures ? ? ?MEDICATIONS ORDERED IN ED: ?Medications - No data to display ? ? ?IMPRESSION / MDM / ASSESSMENT AND PLAN / ED COURSE  ?I reviewed the triage vital signs and the nursing notes. ? ?----------------------------------------- ?6:27 PM on 10/01/2021 ?----------------------------------------- ?Patient's chest tightness is not associated with exercise at all.  Her troponins not changed ?EKG looks very similar.  I will let her go follow-up with cardiology hopefully tomorrow the  next day.  I have given her cardiologist phone number asked her to call in the morning let them know that she was seen in the emergency department with chest pain..  The fact that the pain is not associated with exercise is somewhat reassuring as is the lack of change in her troponins. ? ? ?The patient is on the cardiac monitor to evaluate for evidence of arrhythmia and/or significant heart rate changes. ? ? ? ?FINAL CLINICAL IMPRESSION(S) / ED DIAGNOSES  ? ?Final diagnoses:  ?Chest pain, unspecified type  ? ? ? ?Rx / DC Orders  ? ?ED Discharge Orders   ? ?      Ordered  ?  Ambulatory referral to Cardiology       ?Comments: If you have not heard from the Cardiology office within the next 72 hours please call [phone].  ? 10/01/21 1821  ? ?  ?  ? ?  ? ? ? ?Note:  This document was prepared using Dragon voice recognition software and may include unintentional dictation errors. ?  ?Nena Polio, MD ?10/01/21 1910 ? ?

## 2021-10-02 ENCOUNTER — Ambulatory Visit: Payer: Medicare HMO | Admitting: Cardiovascular Disease

## 2021-10-02 ENCOUNTER — Encounter: Payer: Self-pay | Admitting: Cardiovascular Disease

## 2021-10-02 VITALS — BP 132/70 | HR 61 | Ht 61.0 in | Wt 158.0 lb

## 2021-10-02 DIAGNOSIS — R072 Precordial pain: Secondary | ICD-10-CM

## 2021-10-02 DIAGNOSIS — R079 Chest pain, unspecified: Secondary | ICD-10-CM | POA: Diagnosis not present

## 2021-10-02 DIAGNOSIS — I350 Nonrheumatic aortic (valve) stenosis: Secondary | ICD-10-CM | POA: Diagnosis not present

## 2021-10-02 DIAGNOSIS — I209 Angina pectoris, unspecified: Secondary | ICD-10-CM

## 2021-10-02 MED ORDER — METOPROLOL TARTRATE 50 MG PO TABS: 50.0000 mg | ORAL_TABLET | Freq: Once | ORAL | 0 refills | Status: AC

## 2021-10-02 MED ORDER — FAMOTIDINE 20 MG PO TABS: ORAL_TABLET | ORAL | Status: AC

## 2021-10-02 NOTE — Patient Instructions (Addendum)
?  Medication Instructions:  ? ?Famotidine 1-2 pills  as needed  (20 mg pill), for acid (You can purchase this over the counter) ? ?If you need a refill on your cardiac medications before your next appointment, please call your pharmacy.  ? ?Lab work: ?No new labs needed ? ?Testing/Procedures: ? ?Your physician has requested that you have cardiac CT. Cardiac computed tomography (CT) is a painless test that uses an x-ray machine to take clear, detailed pictures of your heart.  ? ?Your cardiac CT will be scheduled at: ? ?[address] ?[address] ?[place], [place] [address] ?[phone] ? ?Thursday May 11th, at 11:00 ? ?Please arrive 15 mins early for check-in and test prep. ? ? ? ?Please follow these instructions carefully (unless otherwise directed): ? ? ?On the Night Before the Test: ?Be sure to Drink plenty of water. ?Do not consume any caffeinated/decaffeinated beverages or chocolate 12 hours prior to your test. ? ? ? ?On the Day of the Test: ?Drink plenty of water until 1 hour prior to the test. ?Do not eat any food 4 hours prior to the test. ?You may take your regular medications prior to the test.  ?Take metoprolol (Lopressor) 50 MG two hours prior to test. ?FEMALES- please wear underwire-free bra if available, avoid dresses & tight clothing ? ?     ?After the Test: ?Drink plenty of water. ?After receiving IV contrast, you may experience a mild flushed feeling. This is normal. ?On occasion, you may experience a mild rash up to 24 hours after the test. This is not dangerous. If this occurs, you can take Benadryl 25 mg and increase your fluid intake. ?If you experience trouble breathing, this can be serious. If it is severe call 911 IMMEDIATELY. If it is mild, please call our office. ?If you take any of these medications: Glipizide/Metformin, Avandament, Glucavance, please do not take 48 hours after completing test unless otherwise instructed. ? ?Please allow 2-4  weeks for scheduling of routine cardiac CTs. Some insurance companies require a pre-authorization which may delay scheduling of this test.  ? ?For non-scheduling related questions, please contact the cardiac imaging nurse navigator should you have any questions/concerns: ?Marchia Bond, Cardiac Imaging Nurse Navigator ?Gordy Clement, Cardiac Imaging Nurse Navigator ?[facility] ?Direct Office Dial: [phone]  ? ?For scheduling needs, including cancellations and rescheduling, please call [place], [phone].  ? ? ?Follow-Up: ?At [facility], you and your health needs are our priority.  As part of our continuing mission to provide you with exceptional heart care, we have created designated Provider Care Teams.  These Care Teams include your primary Cardiologist (physician) and Advanced Practice Providers (APPs -  Physician Assistants and Nurse Practitioners) who all work together to provide you with the care you need, when you need it. ? ?You will need a follow up appointment as needed ? ?Providers on your designated Care Team:   ?Murray Hodgkins, NP ?Christell Faith, PA-C ?Cadence Kathlen Mody, PA-C ? ?COVID-19 Vaccine Information can be found at: [email or web address] For questions related to vaccine distribution or appointments, please email [email or web address] or call [phone].  ? ?

## 2021-10-02 NOTE — Progress Notes (Addendum)
Cardiology Office Note ? ?Date:  10/02/2021  ? ?ID:  Felicia Daniels, DOB Feb 04, 1941, MRN [id number] ? ?PCP:  Kirk Ruths, MD  ? ?Chief Complaint  ?Patient presents with  ? New Patient (Initial Visit)  ?  ED follow up for chest pain. Meds reviewed verbally with patient.   ? ? ?HPI:  ?Ms. Felicia Daniels is a 81 year old woman with past medical history of ?Aortic valve stenosis, mild, previously followed at [facility] ?Hypertension ?Primary biliary cholangitis ?Who presents for new patient evaluation of her mild aortic valve stenosis, chest pain symptoms ? ?Outside cardiology records reviewed ?No known coronary artery disease ? ?Seen in the emergency room for chest pain Yesterday ?Described as a substernal chest tightness,  mild radiation to left arm comes and goes.  Is not associated with nausea or vomiting or shortness of breath or sweating.  It is not associated with exercise either. ? ?Typically goes walking on a regular basis, does gardening ? had to stop and rest for a few minutes in the middle of her walk.  Does not get any chest tightness on the walk.  Blood pressure elevated in the ER ? ?Blood pressure elevated in the ER 145/111 ? ?Discussed recent events leading to emergency room evaluation yesterday ?Still with little tenderness in the chest on today's visit ? ?Echo 07/10/21 -  ?EF>55% GLS-20.9%, g2dd, mild LVH, sev LAE, nl RVfx, mild AS, peak 48mHg, mean 159mg valve size 1.4cm2, RVSP 3030m, E/e'18, slight increase in aortic valve gradient, compared to 06/20/15. ? ?Lab work reviewed ?Total chol 177, LDL 90, not on a statin ? ?Reports that she was started on amlodipine 5 on last visit to cardiology ?BP better at home ? ?EKG personally reviewed by myself on todays visit ?Normal sinus rhythm with nonspecific ST abnormality ? ?PMH:   has a past medical history of Anemia, Arthritis, Cholelithiasis, Chronic cystitis, Complication of anesthesia, Diverticulosis, Dysphagia (02/08/2014), Fibrocystic disease of breast,  GERD (gastroesophageal reflux disease), Heart murmur, Hypothyroidism, Obesity, Pancreatitis, PONV (postoperative nausea and vomiting), and SUI (stress urinary incontinence, female). ? ?PSH:    ?Past Surgical History:  ?Procedure Laterality Date  ? ABDOMINAL HYSTERECTOMY    ? BREAST BIOPSY Right 06/12/2021  ? stereo bx, asymmetry, "COIL" clip-path pending  ? BREAST CYST ASPIRATION Left   ? neg  ? CHOLECYSTECTOMY    ? COLONOSCOPY  02/26/2003, 12/07/2008, 02/26/2014  ? x 3, FH colon polyps  ? COLONOSCOPY WITH PROPOFOL N/A 12/27/2019  ? Procedure: COLONOSCOPY WITH PROPOFOL;  Surgeon: ByrRobert BellowD;  [facility];  Service: Endoscopy;  Laterality: N/A;  ? ERCP  05/01/2015  ? ESOPHAGOGASTRODUODENOSCOPY  02/26/2014  ? ESOPHAGOGASTRODUODENOSCOPY N/A 12/27/2019  ? Procedure: ESOPHAGOGASTRODUODENOSCOPY (EGD);  Surgeon: ByrRobert BellowD;  [facility];  Service: Endoscopy;  Laterality: N/A;  ? ESOPHAGOGASTRODUODENOSCOPY (EGD) WITH PROPOFOL N/A 03/21/2018  ? Procedure: ESOPHAGOGASTRODUODENOSCOPY (EGD) WITH PROPOFOL;  Surgeon: EllManya SilvasD;  [facility];  Service: Endoscopy;  Laterality: N/A;  ? FINGER ARTHROPLASTY Right 09/20/2015  ? Procedure: FINGER ARTHROPLASTY RIGHT THUMB CMCEmmonsSurgeon: HarLeanor KailD;  [facility];  Laterality: Right;  ? JOINT REPLACEMENT    ? KNEE ARTHROSCOPY Left 2001  ? MENISCUS REPAIR Right   ? PANCREAS SURGERY    ? blockage surgery  ? TONSILLECTOMY    ? TOTAL KNEE ARTHROPLASTY Left 03/28/2019  ? Procedure: TOTAL KNEE ARTHROPLASTY;  Surgeon: PogCorky MullD;  Location: [facility];  Service: Orthopedics;  Laterality: Left;  ? TRIGGER FINGER RELEASE    ? ? ?Current Outpatient Medications  ?Medication Sig Dispense Refill  ? acetaminophen (TYLENOL) 500 MG tablet Take 1,000 mg by mouth every 6 (six) hours as needed for moderate pain.     ? amLODipine (NORVASC) 5 MG tablet Take 1 tablet (5 mg total) by  mouth daily. 180 tablet 3  ? Cholecalciferol (VITAMIN D3) 50 MCG (2000 UT) capsule Take 2,000 Units by mouth daily.     ? Multiple Vitamin (MULTI-VITAMINS) TABS Take 1 tablet by mouth daily.    ? traMADol (ULTRAM) 50 MG tablet Take 1-2 tablets (50-100 mg total) by mouth every 6 (six) hours as needed for moderate pain. 40 tablet 0  ? ursodiol (ACTIGALL) 500 MG tablet Take 500 mg by mouth 2 (two) times daily.     ? ?No current facility-administered medications for this visit.  ? ? ? ?Allergies:   Synthroid [levothyroxine sodium], Ampicillin, Keflex [cephalexin], Penicillins, and Tape  ? ?Social History:  The patient  reports that she has never smoked. She has never used smokeless tobacco. She reports that she does not drink alcohol and does not use drugs.  ? ?Family History:   family history includes Breast cancer in her maternal aunt; Breast cancer (age of onset: 45) in her daughter; Skin cancer in her mother.  ? ? ?Review of Systems: ?Review of Systems  ?Constitutional: Negative.   ?HENT: Negative.    ?Respiratory: Negative.    ?Cardiovascular:  Positive for chest pain.  ?Gastrointestinal: Negative.   ?Musculoskeletal: Negative.   ?Neurological: Negative.   ?Psychiatric/Behavioral: Negative.    ?All other systems reviewed and are negative. ? ? ?PHYSICAL EXAM: ?VS:  BP 132/70 (BP Location: Left Arm, Patient Position: Sitting, Cuff Size: Normal)   Pulse 61   Ht '5\' 1"'$  (1.549 m)   Wt 158 lb (71.7 kg)   SpO2 99%   BMI 29.85 kg/m?  , BMI Body mass index is 29.85 kg/m?. ?GEN: Well nourished, well developed, in no acute distress ?HEENT: normal ?Neck: no JVD, carotid bruits, or masses ?Cardiac: RRR; 2/6 systolic ejection murmur appreciated right sternal border  ?No rubs, or gallops,no edema  ?Respiratory:  clear to auscultation bilaterally, normal work of breathing ?GI: soft, nontender, nondistended, + BS ?MS: no deformity or atrophy ?Skin: warm and dry, no rash ?Neuro:  Strength and sensation are intact ?Psych:  euthymic mood, full affect ? ? ?Recent Labs: ?10/01/2021: BUN 20; Creatinine, Ser 0.73; Hemoglobin 12.8; Platelets 215; Potassium 4.3; Sodium 142  ? ? ?Lipid Panel ?No results found for: CHOL, HDL, LDLCALC, TRIG ?  ? ?Wt Readings from Last 3 Encounters:  ?10/02/21 158 lb (71.7 kg)  ?10/01/21 158 lb (71.7 kg)  ?12/27/19 152 lb (68.9 kg)  ?  ? ?ASSESSMENT AND PLAN: ? ?Problem List Items Addressed This Visit   ? ?  ? Cardiology Problems  ? Aortic valve stenosis  ? Relevant Medications  ? amLODipine (NORVASC) 5 MG tablet  ? Other Relevant Orders  ? EKG 12-Lead  ?  ? Other  ? Chest pain of uncertain etiology  ? Relevant Orders  ? EKG 12-Lead  ? ?Chest pain/angina ?Reports having significant chest pain leading to evaluation in the emergency room yesterday ?Chest pain is continued today though mild ?Has some shortness of breath on her walks, sometimes has to stop ?-Symptoms concerning for angina, discussed various work-up available ?-We will schedule her for cardiac CTA ?Order placed, details of the test discussed ?-Prefers not to  be on medications at this time ?We will hold off on adding aspirin, beta-blocker, statin ?If symptoms get worse recommend she call our office ? ?Aortic valve stenosis ?Mild on routine echo done at outside facility ?We will need periodic follow-up ? ?GERD ?She does report remote history of GERD, previously on PPI but this did not seem to help ?Discussed other options as she is taking Tums including H2 blocker such as generic Pepcid ?Also discussed elevating head of bed, avoiding large meals ?Reports that she might of been told she could have a hiatal hernia ?-Discussed that CTA as above may help evaluate this further ? ? ? Total encounter time more than 60  minutes ? Greater than 50% was spent in counseling and coordination of care with the patient ? ? ? ?Signed, ?Esmond Plants, M.D., Ph.D. ?[facility] [place], [place] ?[phone] ?

## 2021-10-08 ENCOUNTER — Telehealth (HOSPITAL_COMMUNITY): Payer: Self-pay | Admitting: Emergency Medicine

## 2021-10-08 NOTE — Telephone Encounter (Signed)
Reaching out to patient to offer assistance regarding upcoming cardiac imaging study; pt verbalizes understanding of appt date/time, parking situation and where to check in, pre-test NPO status and medications ordered, and verified current allergies; name and call back number provided for further questions should they arise ?Marchia Bond RN Navigator Cardiac Imaging ?[facility] ?[phone] office ?[phone] cell ? ?Denies iv issues ?'50mg'$  metoprolol tartrate ?Arrival 1045 ? ?

## 2021-10-09 ENCOUNTER — Ambulatory Visit
Admission: RE | Admit: 2021-10-09 | Discharge: 2021-10-09 | Disposition: A | Discharge status: Home / Self Care | Payer: Medicare HMO | Source: Ambulatory Visit | Attending: Cardiovascular Disease | Admitting: Cardiovascular Disease

## 2021-10-09 DIAGNOSIS — R072 Precordial pain: Secondary | ICD-10-CM

## 2021-10-09 MED ORDER — IOHEXOL 350 MG/ML SOLN
75.0000 mL | Freq: Once | INTRAVENOUS | Status: AC | PRN
  Administered 2021-10-09: 75 mL via INTRAVENOUS

## 2021-10-09 MED ORDER — NITROGLYCERIN 0.4 MG SL SUBL
0.8000 mg | SUBLINGUAL_TABLET | Freq: Once | SUBLINGUAL | Status: AC
  Administered 2021-10-09: 0.8 mg via SUBLINGUAL

## 2021-10-09 NOTE — Progress Notes (Signed)
Patient tolerated CT well. Drank water after. Vital signs stable encourage to drink water throughout day.Reasons explained and verbalized understanding. Ambulated steady gait.  

## 2021-10-13 ENCOUNTER — Telehealth: Payer: Self-pay | Admitting: Emergency Medicine

## 2021-10-13 NOTE — Telephone Encounter (Signed)
Called patient. No answer. Lmtcb.  

## 2021-10-13 NOTE — Telephone Encounter (Signed)
-----   Message from Minna Merritts, MD sent at 10/12/2021 12:08 PM EDT ----- ?Cardiac CTA ?Mild emphysema noted and pulmonary scarring ?Plaquing noted in the aorta ?Calcification on aortic valve, small hiatal hernia ? ?Nonobstructive coronary disease, low calcium score 94 ?No indication of severe coronary stenosis requiring cardiac catheterization ?

## 2021-10-13 NOTE — Telephone Encounter (Signed)
Patient returning call.

## 2021-10-14 NOTE — Telephone Encounter (Signed)
Called and spoke with patient. Results reviewed with patient, pt verbalized understanding,  questions (if any) answered.   ?

## 2022-08-13 ENCOUNTER — Other Ambulatory Visit: Payer: Self-pay | Admitting: Internal Medicine

## 2022-08-13 DIAGNOSIS — Z1231 Encounter for screening mammogram for malignant neoplasm of breast: Secondary | ICD-10-CM

## 2022-08-17 ENCOUNTER — Ambulatory Visit
Admission: RE | Admit: 2022-08-17 | Discharge: 2022-08-17 | Disposition: A | Discharge status: Home / Self Care | Payer: Medicare HMO | Source: Ambulatory Visit | Attending: Internal Medicine | Admitting: Internal Medicine

## 2022-08-17 DIAGNOSIS — Z1231 Encounter for screening mammogram for malignant neoplasm of breast: Secondary | ICD-10-CM | POA: Diagnosis present

## 2022-09-23 ENCOUNTER — Encounter: Payer: Self-pay | Admitting: Gastroenterology

## 2022-09-23 ENCOUNTER — Telehealth: Payer: Medicare HMO | Admitting: Gastroenterology

## 2022-09-23 VITALS — Ht 61.0 in | Wt 163.0 lb

## 2022-09-23 DIAGNOSIS — R142 Eructation: Secondary | ICD-10-CM | POA: Diagnosis not present

## 2022-09-23 NOTE — Progress Notes (Signed)
Midge Minium, MD [address]  [address]  [place], [place] [address]  Main: [phone]  Fax: [phone]    Gastroenterology Virtual/Video Visit  Referring Provider:     Lauro Regulus, MD Primary Care Physician:  Lauro Regulus, MD Primary Gastroenterologist:  Dr.Leonidas Boateng Servando Snare Reason for Consultation:     Burping        HPI:    Virtual Visit via Video Note Location of the patient: Home Location of provider: Home Office Participating persons: The patient and myself.  I connected with Bary Castilla on 09/23/22 at  8:45 AM EDT by a video enabled telemedicine application and verified that I am speaking with the correct person using two identifiers.   I discussed the limitations of evaluation and management by telemedicine and the availability of in person appointments. The patient expressed understanding and agreed to proceed.  Verbal consent to proceed obtained.  History of Present Illness: Felicia Daniels is a 82 y.o. female referred by Dr. Dareen Piano, Marya Amsler, MD  for consultation & management of belching.  This patient comes to see me after being followed in the past by [facility].  The patient states that she no longer wants to see another clinic GI.  The patient's clinical history from her previous gastroenterologist was as follows:  -- 2004: US-guided liver bx for "dark spot" on liver, no documentation or follow-up. : Cholecystectomy due to 1st episode of pancreatitis. (Subsequent episodes in 2012, 2015, 2016). -- 02/26/14: EGD (indic: dysphagia) - Normal esophagus (dilated). Normal stomach and duodenum. : CSY (indic: FHx polyps - father) - Sigmoid and descending diverticulosis. Internal hemorrhoids. Repeat in 01/2019. -- 04/01/15: Upper EUS (Dr. Shana Chute, dilated pancreatic duct on CT, chronic pancreatitis) - Normal EGD. EUS c/w moderate-severe chronic pancreatitis. Dominant stone in head of pancreatic duct resulting in upstream pancreatic ductal  dilation. On benign appearing lymph node in porta hepatis region.  -- 05/01/15: ERCP (Dr. Shana Chute) - Entire main bile duct mildly dilated. Marked dilation of entire opacified area of pancreatic duct suggesting obstructive stone, unable to gain wire access beyond level of stone, endoscopic therapy not possible. Refer to surgeon for constipation of [place] procedure. -- 07/09/15: Open transduodenal sphincterotomy and pancreatic duct stone retrieval (Dr. Kathrynn Ducking). -- 02/11/18: CT A/P w/ contrast (indic: epigastric pain, h/o pancreatitis) - Mild circumferential wall thickening in the distal esophagus with low attenuation raising concern for c/w chronic pancreatitis. 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 S/p cholecystectomy. Pneumobilia and gas within the main  pancreatic duct suggests prior sphincterotomy. Aortic Atherosclerosis. -- 03/07/18: ALP 267, AST 46, ALT 44. : GGT 245. : ANA (-). AMA (+, 24.3). ASMA WNL (12).  : Ferritin WNL (55). : Ceruloplasmin WNL (33.9).  -- 03/10/18: Ig G WNL (1458). IgA WNL (331). IgM 449. IgE WNL (5). : TSH 5.794. : ANCA panel WNL. -- 03/21/18: BaS (indic: dysphagia) - Mild Schatzki ring (dilated). Small hiatal hernia. Normal duodenum. -- 04/06/18: HAV IgM (-). HBs Ag (-). HBc IgM ab (-). HBc IgM ab (-). HCV ab (-).   It appears that the patient was also in contact with [facility] in [place] back in January of last year with a Dr. Sharlene Motts and this was for a prescription of ursodiol.  It appears in July 2021 the patient underwent an EGD and colonoscopy in outside facility in [facility] with those records not being accessible to me on her chart.  The  patient reports that her belching is during the day.  She denies any other worrying symptoms.  She does drink a carbonated drink every few days and does report excessive salivation which may be contributing to her swallowing air.  Past Medical  History:  Diagnosis Date   Anemia    resolved   Arthritis    Cholelithiasis    Chronic cystitis    Complication of anesthesia    hard to wake up from pancreas blockage surgery at [facility] In January, had to stay extra days in ICU.   Diverticulosis    Dysphagia 02/08/2014   Fibrocystic disease of breast    GERD (gastroesophageal reflux disease)    Heart murmur    Hypothyroidism    Obesity    Pancreatitis    PONV (postoperative nausea and vomiting)    SUI (stress urinary incontinence, female)     Past Surgical History:  Procedure Laterality Date   ABDOMINAL HYSTERECTOMY     BREAST BIOPSY Right 06/12/2021   stereo bx, asymmetry, "COIL" clip-path pending   BREAST CYST ASPIRATION Left    neg   BREAST EXCISIONAL BIOPSY Right 07/2021   CHOLECYSTECTOMY     COLONOSCOPY  02/26/2003, 12/07/2008, 02/26/2014   x 3, FH colon polyps   COLONOSCOPY WITH PROPOFOL N/A 12/27/2019   Procedure: COLONOSCOPY WITH PROPOFOL;  Surgeon: Earline Mayotte, MD;  Location: [facility] ENDOSCOPY;  Service: Endoscopy;  Laterality: N/A;   ERCP  05/01/2015   ESOPHAGOGASTRODUODENOSCOPY  02/26/2014   ESOPHAGOGASTRODUODENOSCOPY N/A 12/27/2019   Procedure: ESOPHAGOGASTRODUODENOSCOPY (EGD);  Surgeon: Earline Mayotte, MD;  Location: [facility] ENDOSCOPY;  Service: Endoscopy;  Laterality: N/A;   ESOPHAGOGASTRODUODENOSCOPY (EGD) WITH PROPOFOL N/A 03/21/2018   Procedure: ESOPHAGOGASTRODUODENOSCOPY (EGD) WITH PROPOFOL;  Surgeon: Scot Jun, MD;  Location: [facility] ENDOSCOPY;  Service: Endoscopy;  Laterality: N/A;   FINGER ARTHROPLASTY Right 09/20/2015   Procedure: FINGER ARTHROPLASTY RIGHT THUMB [facility];  Surgeon: Erin Sons, MD;  Location: [facility];  Service: Orthopedics;  Laterality: Right;   JOINT REPLACEMENT     KNEE ARTHROSCOPY Left 2001   MENISCUS REPAIR Right    PANCREAS SURGERY     blockage surgery   TONSILLECTOMY     TOTAL KNEE ARTHROPLASTY Left 03/28/2019   Procedure: TOTAL KNEE ARTHROPLASTY;  Surgeon:  Christena Flake, MD;  Location: [facility] ORS;  Service: Orthopedics;  Laterality: Left;   TRIGGER FINGER RELEASE      Prior to Admission medications   Medication Sig Start Date End Date Taking? Authorizing Provider  acetaminophen (TYLENOL) 500 MG tablet Take 1,000 mg by mouth every 6 (six) hours as needed for moderate pain.     [provider]  amLODipine (NORVASC) 5 MG tablet Take 1 tablet (5 mg total) by mouth daily. 10/02/21   Antonieta Iba, MD  Cholecalciferol (VITAMIN D3) 50 MCG (2000 UT) capsule Take 2,000 Units by mouth daily.     [provider]  famotidine (PEPCID) 20 MG tablet Take 1-2 tablets a day as needed for heartburn or indigestion. 10/02/21   Antonieta Iba, MD  metoprolol tartrate (LOPRESSOR) 50 MG tablet Take 1 tablet (50 mg total) by mouth once for 1 dose. Take 2 hours prior to your CT scan. 10/02/21 10/02/21  Antonieta Iba, MD  Multiple Vitamin (MULTI-VITAMINS) TABS Take 1 tablet by mouth daily.    [provider]  traMADol (ULTRAM) 50 MG tablet Take 1-2 tablets (50-100 mg total) by mouth every 6 (six) hours as needed for moderate pain. 03/30/19  Anson Oregon, PA-C  ursodiol (ACTIGALL) 500 MG tablet Take 500 mg by mouth 2 (two) times daily.  10/10/14   [provider]    Family History  Problem Relation Age of Onset   Skin cancer Mother    Breast cancer Daughter 31   Breast cancer Maternal Aunt      Social History   Tobacco Use   Smoking status: Never   Smokeless tobacco: Never  Vaping Use   Vaping Use: Never used  Substance Use Topics   Alcohol use: No    Alcohol/week: 0.0 standard drinks of alcohol   Drug use: No    Allergies as of 09/23/2022 - Review Complete 10/09/2021  Allergen Reaction Noted   Synthroid [levothyroxine sodium] Shortness Of Breath and Nausea And Vomiting 02/09/2015   Ampicillin Itching 09/04/2016   Keflex [cephalexin] Rash 01/28/2015   Penicillins Rash 01/28/2015   Tape Rash 09/11/2015     Review of Systems:    All systems reviewed and negative except where noted in HPI.   Observations/Objective:  Labs: CBC    Component Value Date/Time   WBC 5.2 10/01/2021 1337   RBC 4.27 10/01/2021 1337   HGB 12.8 10/01/2021 1337   HGB 11.1 (L) 09/26/2011 0541   HCT 40.1 10/01/2021 1337   HCT 33.8 (L) 09/26/2011 0541   PLT 215 10/01/2021 1337   PLT 177 09/26/2011 0541   MCV 93.9 10/01/2021 1337   MCV 87 09/26/2011 0541   MCH 30.0 10/01/2021 1337   MCHC 31.9 10/01/2021 1337   RDW 13.6 10/01/2021 1337   RDW 16.0 (H) 09/26/2011 0541   LYMPHSABS 1.9 03/29/2019 0458   LYMPHSABS 1.6 09/26/2011 0541   MONOABS 0.8 03/29/2019 0458   MONOABS 0.5 09/26/2011 0541   EOSABS 0.1 03/29/2019 0458   EOSABS 0.2 09/26/2011 0541   BASOSABS 0.0 03/29/2019 0458   BASOSABS 0.0 09/26/2011 0541   CMP     Component Value Date/Time   NA 142 10/01/2021 1337   NA 146 (H) 09/26/2011 0541   K 4.3 10/01/2021 1337   K 3.4 (L) 09/26/2011 0541   CL 107 10/01/2021 1337   CL 108 (H) 09/26/2011 0541   CO2 28 10/01/2021 1337   CO2 29 09/26/2011 0541   GLUCOSE 97 10/01/2021 1337   GLUCOSE 80 09/26/2011 0541   BUN 20 10/01/2021 1337   BUN 4 (L) 09/26/2011 0541   CREATININE 0.73 10/01/2021 1337   CREATININE 0.54 (L) 09/26/2011 0541   CALCIUM 9.6 10/01/2021 1337   CALCIUM 8.5 09/26/2011 0541   PROT 7.5 02/11/2018 0959   PROT 6.2 (L) 09/26/2011 0541   ALBUMIN 3.9 02/11/2018 0959   ALBUMIN 2.7 (L) 09/26/2011 0541   AST 50 (H) 02/11/2018 0959   AST 34 09/26/2011 0541   ALT 45 (H) 02/11/2018 0959   ALT 44 09/26/2011 0541   ALKPHOS 259 (H) 02/11/2018 0959   ALKPHOS 140 (H) 09/26/2011 0541   BILITOT 0.8 02/11/2018 0959   BILITOT 0.6 09/26/2011 0541   GFRNONAA >60 10/01/2021 1337   GFRNONAA >60 09/26/2011 0541   GFRAA >60 03/31/2019 0532   GFRAA >60 09/26/2011 0541    Imaging Studies: No results found.  Assessment and Plan:   MERELYN KLUMP is a 82 y.o. y/o female has been referred for  belching.  The patient has been explained that the stomach does not make any air and that she is swallowing air as the cause of her belching.  The patient has been told to avoid  chewing gum, drinking with a straw, eating fast or drinking carbonated drinks.  She has also been told to be mindful of her paraphasia to see if she can decrease it.  She has been told that there is no reason to do any procedures at this time since burping up of air is related to swallowing of air and is not a sign of anything else.  The patient has been explained the plan and agrees with it.  Follow Up Instructions:  I discussed the assessment and treatment plan with the patient. The patient was provided an opportunity to ask questions and all were answered. The patient agreed with the plan and demonstrated an understanding of the instructions.   The patient was advised to call back or seek an in-person evaluation if the symptoms worsen or if the condition fails to improve as anticipated.  I provided 25 minutes of non-face-to-face time during this encounter including chart review In preparation for the encounter.   Midge Minium, MD  Speech recognition software was used to dictate the above note.

## 2022-12-10 IMAGING — CT CT HEART MORP W/ CTA COR W/ SCORE W/ CA W/CM &/OR W/O CM
2 of 13 series · 5 of 20 positions shown, 6 images · non-contrast
Comparison: None.

Addendum:
CLINICAL DATA: Chest pain

EXAM:
Cardiac/Coronary  CTA
TECHNIQUE: The patient was scanned on a Siemens Somatom go.Top scanner.

[Series 4: multiphase % cta coronary 0.60 · axial · 0.33mm/px · z∈[-1086,-1027]mm · 3 of 3245 slices shown, 4 images]
[im 812/3245  vessel]
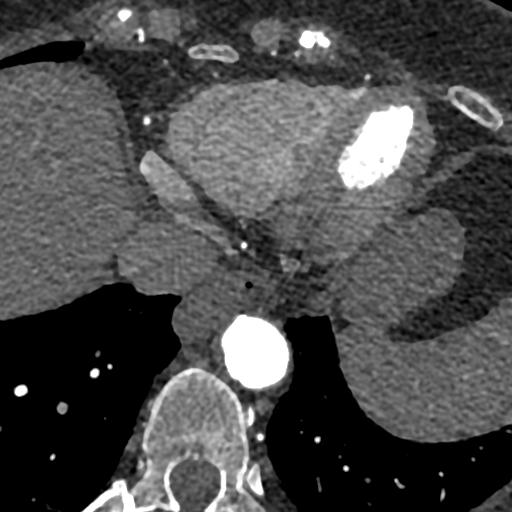
[im 812/3245  lung]
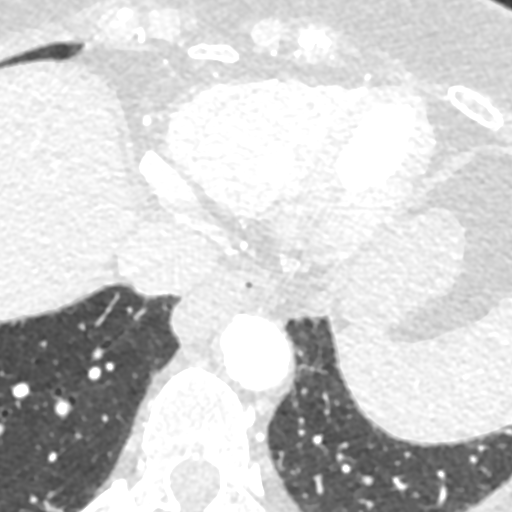
[im 1623/3245  vessel]
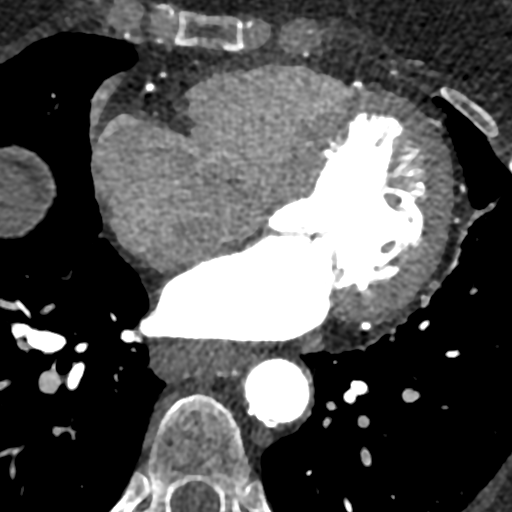
[im 2434/3245  vessel]
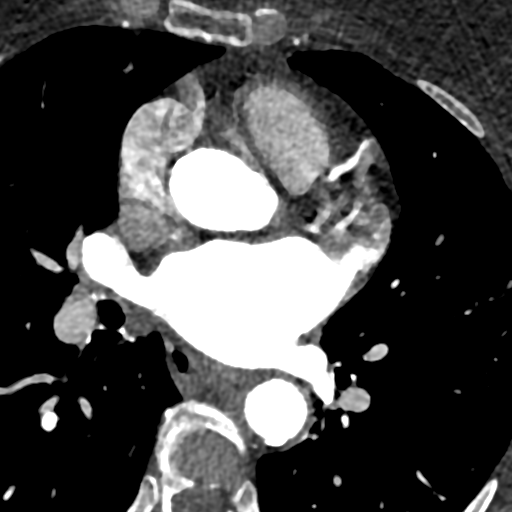

[Series 33: ms multiphase cta coronary 0.60 · axial · 0.33mm/px · z∈[-1076,-1037]mm · 2 of 2655 slices shown]
[im 885/2655  vessel]
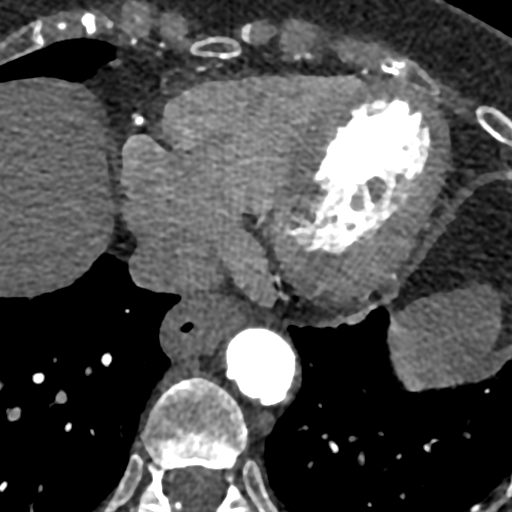
[im 1770/2655  vessel]
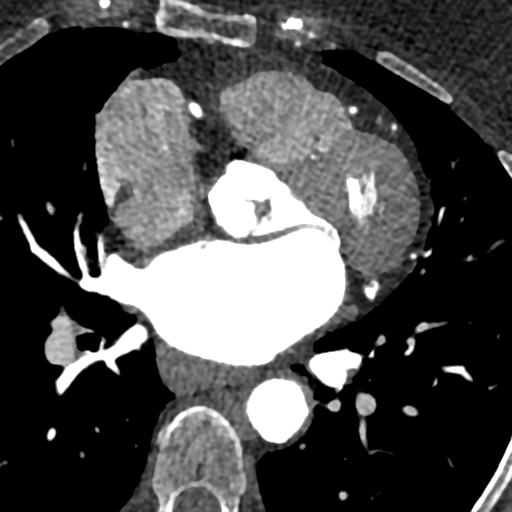

[5 of 20 positions shown; findings below may reference images not displayed]

:
A retrospective scan was triggered in the descending thoracic aorta.
Axial non-contrast 3 mm slices were carried out through the heart.
The data set was analyzed on a dedicated work station and scored
using the Agatson method. Gantry rotation speed was 330 msecs and
collimation was .6 mm. 50mg of metoprolol and 0.8 mg of sl NTG was
given. The 3D data set was reconstructed in 5% intervals of the
60-95 % of the R-R cycle. Diastolic phases were analyzed on a
dedicated work station using MPR, MIP and VRT modes. The patient
received 75 cc of contrast.
FINDINGS: Aorta: Normal size. aortic root and descending aorta calcifications.
No dissection.

Aortic Valve:  Trileaflet.  mild calcifications.

Coronary Arteries:  Normal coronary origin.  Right dominance.

RCA is a dominant artery that gives rise to PDA and PLA. There is no
plaque.

Left main is a large artery that gives rise to LAD and LCX arteries.

LAD has calcified plaque in the proximal LAD causing mild stenosis
(25%).

LCX is a non-dominant artery that gives rise to two obtuse marginal
branches. There is no plaque.

Other findings:

Normal pulmonary vein drainage into the left atrium.

Normal left atrial appendage without a thrombus.

Normal size of the pulmonary artery.
IMPRESSION: 1. Coronary calcium score of 94.3. This was 47th percentile for age
and sex matched control.

2. Normal coronary origin with right dominance.

3. Calcified plaque causing mild proximal LAD stenosis (25%).

4. CAD-RADS 2. Mild non-obstructive CAD (25-49%). Consider
non-atherosclerotic causes of chest pain. Consider preventive
therapy and risk factor modification.

EXAM:
OVER-READ INTERPRETATION  CT CHEST

The following report is an over-read performed by radiologist Dr. IBRAJ
Sinacori [REDACTED] on 10/09/2021. This over-read
does not include interpretation of cardiac or coronary anatomy or
pathology. The cardiac interpretation by the cardiologist is
attached.
FINDINGS: The thoracic aorta is within normal limits in caliber. Advanced
atherosclerotic calcifications are noted. Heavy calcification noted
around the aortic valve.

Scattered mediastinal and hilar lymph nodes but no mass or overt
adenopathy. Small hiatal hernia noted.

No acute pulmonary findings. No worrisome pulmonary lesions. Mild
emphysematous changes and areas of pulmonary scarring.

The visualized bony structures are intact.
IMPRESSION: 1. Mild emphysematous changes and pulmonary scarring.
2. Advanced atherosclerotic calcifications involving the aorta and
branch vessels.
3. Heavy calcification noted around the aortic valve.
4. Small hiatal hernia.

Emphysema ([id number]).

*** End of Addendum ***
:
A retrospective scan was triggered in the descending thoracic aorta.
Axial non-contrast 3 mm slices were carried out through the heart.
The data set was analyzed on a dedicated work station and scored
using the Agatson method. Gantry rotation speed was 330 msecs and
collimation was .6 mm. 50mg of metoprolol and 0.8 mg of sl NTG was
given. The 3D data set was reconstructed in 5% intervals of the
60-95 % of the R-R cycle. Diastolic phases were analyzed on a
dedicated work station using MPR, MIP and VRT modes. The patient
received 75 cc of contrast.
FINDINGS: Aorta: Normal size. aortic root and descending aorta calcifications.
No dissection.

Aortic Valve:  Trileaflet.  mild calcifications.

Coronary Arteries:  Normal coronary origin.  Right dominance.

RCA is a dominant artery that gives rise to PDA and PLA. There is no
plaque.

Left main is a large artery that gives rise to LAD and LCX arteries.

LAD has calcified plaque in the proximal LAD causing mild stenosis
(25%).

LCX is a non-dominant artery that gives rise to two obtuse marginal
branches. There is no plaque.

Other findings:

Normal pulmonary vein drainage into the left atrium.

Normal left atrial appendage without a thrombus.

Normal size of the pulmonary artery.
IMPRESSION: 1. Coronary calcium score of 94.3. This was 47th percentile for age
and sex matched control.

2. Normal coronary origin with right dominance.

3. Calcified plaque causing mild proximal LAD stenosis (25%).

4. CAD-RADS 2. Mild non-obstructive CAD (25-49%). Consider
non-atherosclerotic causes of chest pain. Consider preventive
therapy and risk factor modification.

## 2023-07-28 ENCOUNTER — Other Ambulatory Visit: Payer: Self-pay | Admitting: Internal Medicine

## 2023-07-28 DIAGNOSIS — Z1231 Encounter for screening mammogram for malignant neoplasm of breast: Secondary | ICD-10-CM

## 2023-08-18 ENCOUNTER — Ambulatory Visit: Payer: Medicare HMO

## 2023-08-19 ENCOUNTER — Ambulatory Visit
Admission: RE | Admit: 2023-08-19 | Discharge: 2023-08-19 | Disposition: A | Discharge status: Home / Self Care | Payer: Self-pay | Source: Ambulatory Visit | Attending: Internal Medicine | Admitting: Internal Medicine

## 2023-08-19 DIAGNOSIS — Z1231 Encounter for screening mammogram for malignant neoplasm of breast: Secondary | ICD-10-CM | POA: Insufficient documentation

## 2023-12-01 ENCOUNTER — Encounter: Payer: Self-pay | Admitting: Podiatry

## 2023-12-01 ENCOUNTER — Ambulatory Visit (INDEPENDENT_AMBULATORY_CARE_PROVIDER_SITE_OTHER)

## 2023-12-01 ENCOUNTER — Ambulatory Visit: Admitting: Podiatry

## 2023-12-01 DIAGNOSIS — M7751 Other enthesopathy of right foot: Secondary | ICD-10-CM | POA: Diagnosis not present

## 2023-12-01 DIAGNOSIS — M2011 Hallux valgus (acquired), right foot: Secondary | ICD-10-CM

## 2023-12-01 DIAGNOSIS — M7752 Other enthesopathy of left foot: Secondary | ICD-10-CM | POA: Diagnosis not present

## 2023-12-01 DIAGNOSIS — M21611 Bunion of right foot: Secondary | ICD-10-CM

## 2023-12-01 DIAGNOSIS — M21612 Bunion of left foot: Secondary | ICD-10-CM

## 2023-12-01 DIAGNOSIS — G629 Polyneuropathy, unspecified: Secondary | ICD-10-CM

## 2023-12-01 DIAGNOSIS — M2012 Hallux valgus (acquired), left foot: Secondary | ICD-10-CM

## 2023-12-01 NOTE — Patient Instructions (Signed)
  VISIT SUMMARY: Today, you were seen for tightness and numbness in your feet, which is more bothersome at night. We discussed your history of knee replacement, bunions, and low back issues. You are currently active and managing your symptoms with minimal medication.  YOUR PLAN: -IDIOPATHIC NEUROPATHY: Idiopathic neuropathy is a condition where the nerves in your feet are not working properly, causing tightness and numbness. This is common with aging and does not have a specific cause. We recommend using capsaicin cream to help with the symptoms, staying active, and walking regularly. If your symptoms persist or worsen, we may refer you to a neurologist.  -BUNION (HALLUX VALGUS): A bunion is a bony bump that forms on the joint at the base of your big toe. Your bunion is not causing your current symptoms, and there are no major abnormalities or arthritis seen in your X-rays.  -KNEE REPLACEMENT: You had a knee replacement about five years ago, and there are no current issues related to it.  INSTRUCTIONS: Please use capsaicin cream as directed for your neuropathy symptoms. Continue to stay active and walk regularly. If your symptoms persist or worsen, please contact us  for a possible referral to a neurologist.                      Contains text generated by Abridge.                                 Contains text generated by Abridge.

## 2023-12-01 NOTE — Progress Notes (Signed)
 Subjective:  Patient ID: Felicia Daniels, female    DOB: [birth year],  MRN: [id number]  Chief Complaint  Patient presents with   Foot pain    Rm9 Bilataral tightnesss and stiffness in arches/ 2 months not diabetic no treatment    Discussed the use of AI scribe software for clinical note transcription with the patient, who gave verbal consent to proceed.  History of Present Illness Felicia Daniels is an 83 year old female who presents with tightness and numbness in her feet.  She experiences a tight sensation around her feet, particularly bothersome at night. The sensation is described as 'really tight' and is not painful but rather annoying. There is numbness, but no burning, tingling, or shooting pain. The tightness is felt all around her feet and sometimes extends up her leg.  She has a history of a knee replacement approximately five years ago and bunions, though she does not believe they are related to her current foot issues. She has not changed her footwear recently, typically wearing tennis shoes or sandals. She denies being diabetic. She has experienced some swelling in the past, for which she takes a mild diuretic as needed, approximately five times a month.  She has a history of low back issues, including nerve-related pain that was not diagnosed as sciatica. She has received injections and physical therapy for this in the past and continues to perform exercises at home to manage the symptoms. Her legs feel weaker, and she has not been able to walk her usual two to three miles a day due to her knee and foot issues.  She is currently not on any blood pressure medication, as it was causing soreness in her legs. She is on minimal medication, taking only one prescription regularly aside from the diuretic. She is active, engaging in yard work and other physical activities, and finds it difficult to remain still.      Objective:    Physical Exam VASCULAR: DP and PT pulse palpable.  Feet warm and well-perfused bilaterally. Capillary fill time is brisk. DERMATOLOGIC: Normal skin turgor, texture, and temperature. No open lesions, rashes, or ulcerations. NEUROLOGIC: Normal sensation to light touch and pressure. 5/5 strength in feet and legs bilaterally. No paresthesias on examination. ORTHOPEDIC: Smooth pain-free range of motion of all examined joints.  She has no tenderness in foot on palpation. 5/5 strength in foot with resistance testing. No ecchymosis or bruising. No gross deformity. EXTREMITIES:    No images are attached to the encounter.    Results RADIOLOGY Foot X-ray: Hallux valgus deformity with metatarsus adductus, no bony abnormality or arthritic changes (12/01/2023)   Assessment:   1. Peripheral neuritis   2. Hallux valgus with bunions, right   3. Hallux valgus with bunions, left      Plan:  Patient was evaluated and treated and all questions answered.  Assessment and Plan Assessment & Plan Idiopathic neuropathy Bilateral foot tightness and numbness, more pronounced at night, without pain, burning, or tingling. Differential includes nerve irritation or neuropathy, likely idiopathic given absence of diabetes or alcohol use. No major issues like damaged nerve or broken bone identified. Idiopathic neuropathy is common and part of the aging process. No cure typically available, but staying active is beneficial.  Do not think there is a primary pathology within the foot causing this.  Is not lifestyle limiting or particularly painful.  As such would not recommend any further diagnostics or treatment at this time - Advise use of capsaicin cream for neuropathy  symptoms - Recommend staying active and walking regularly -Discussed alternate lacing of shoes to avoid compression neuritis - Consider neurologist referral if symptoms persist or worsen  Bunion (hallux valgus) Presence of bunion not considered the cause of current symptoms. X-rays show hallux valgus  deformity with metatarsal adductus, no bony abnormality or arthritic changes.  Knee replacement Knee replacement performed approximately five years ago with no current issues related to the knee replacement.      No follow-ups on file.
